# Patient Record
Sex: Male | Born: 1955
Health system: Southern US, Community
[De-identification: ages and names within clinical notes are randomized; demographics above are authoritative.]

## PROBLEM LIST (undated history)

## (undated) DIAGNOSIS — E7800 Pure hypercholesterolemia, unspecified: Secondary | ICD-10-CM

## (undated) DIAGNOSIS — J45909 Unspecified asthma, uncomplicated: Secondary | ICD-10-CM

## (undated) DIAGNOSIS — E785 Hyperlipidemia, unspecified: Secondary | ICD-10-CM

## (undated) DIAGNOSIS — I10 Essential (primary) hypertension: Secondary | ICD-10-CM

## (undated) DIAGNOSIS — I219 Acute myocardial infarction, unspecified: Secondary | ICD-10-CM

## (undated) HISTORY — DX: Pure hypercholesterolemia, unspecified: E78.00

## (undated) HISTORY — PX: CORONARY STENT PLACEMENT: SHX1402

---

## 2005-11-08 ENCOUNTER — Emergency Department (HOSPITAL_COMMUNITY): Admission: EM | Admit: 2005-11-08 | Discharge: 2005-11-08 | Payer: Self-pay | Admitting: Emergency Medicine

## 2012-09-03 ENCOUNTER — Other Ambulatory Visit: Payer: Self-pay | Admitting: Allergy

## 2012-09-03 ENCOUNTER — Ambulatory Visit
Admission: RE | Admit: 2012-09-03 | Discharge: 2012-09-03 | Disposition: A | Discharge status: Home / Self Care | Payer: BC Managed Care – PPO | Source: Ambulatory Visit | Attending: Allergy | Admitting: Allergy

## 2012-09-03 DIAGNOSIS — J209 Acute bronchitis, unspecified: Secondary | ICD-10-CM

## 2012-10-25 ENCOUNTER — Emergency Department (HOSPITAL_COMMUNITY)
Admission: EM | Admit: 2012-10-25 | Discharge: 2012-10-25 | Disposition: A | Discharge status: Home / Self Care | Payer: BC Managed Care – PPO | Attending: Emergency Medicine | Admitting: Emergency Medicine

## 2012-10-25 ENCOUNTER — Emergency Department (HOSPITAL_COMMUNITY): Payer: BC Managed Care – PPO

## 2012-10-25 ENCOUNTER — Encounter (HOSPITAL_COMMUNITY): Payer: Self-pay | Admitting: Emergency Medicine

## 2012-10-25 DIAGNOSIS — I252 Old myocardial infarction: Secondary | ICD-10-CM | POA: Insufficient documentation

## 2012-10-25 DIAGNOSIS — I10 Essential (primary) hypertension: Secondary | ICD-10-CM | POA: Insufficient documentation

## 2012-10-25 DIAGNOSIS — R05 Cough: Secondary | ICD-10-CM

## 2012-10-25 DIAGNOSIS — I214 Non-ST elevation (NSTEMI) myocardial infarction: Secondary | ICD-10-CM

## 2012-10-25 DIAGNOSIS — I2510 Atherosclerotic heart disease of native coronary artery without angina pectoris: Secondary | ICD-10-CM

## 2012-10-25 DIAGNOSIS — R0789 Other chest pain: Secondary | ICD-10-CM | POA: Insufficient documentation

## 2012-10-25 DIAGNOSIS — Z792 Long term (current) use of antibiotics: Secondary | ICD-10-CM | POA: Insufficient documentation

## 2012-10-25 DIAGNOSIS — Z79899 Other long term (current) drug therapy: Secondary | ICD-10-CM | POA: Insufficient documentation

## 2012-10-25 DIAGNOSIS — R079 Chest pain, unspecified: Secondary | ICD-10-CM

## 2012-10-25 DIAGNOSIS — Z95820 Peripheral vascular angioplasty status with implants and grafts: Secondary | ICD-10-CM

## 2012-10-25 DIAGNOSIS — Z7982 Long term (current) use of aspirin: Secondary | ICD-10-CM | POA: Insufficient documentation

## 2012-10-25 DIAGNOSIS — J40 Bronchitis, not specified as acute or chronic: Secondary | ICD-10-CM

## 2012-10-25 DIAGNOSIS — R61 Generalized hyperhidrosis: Secondary | ICD-10-CM | POA: Insufficient documentation

## 2012-10-25 DIAGNOSIS — R059 Cough, unspecified: Secondary | ICD-10-CM | POA: Insufficient documentation

## 2012-10-25 DIAGNOSIS — J45909 Unspecified asthma, uncomplicated: Secondary | ICD-10-CM | POA: Insufficient documentation

## 2012-10-25 DIAGNOSIS — E785 Hyperlipidemia, unspecified: Secondary | ICD-10-CM | POA: Insufficient documentation

## 2012-10-25 HISTORY — DX: Essential (primary) hypertension: I10

## 2012-10-25 HISTORY — DX: Acute myocardial infarction, unspecified: I21.9

## 2012-10-25 HISTORY — DX: Unspecified asthma, uncomplicated: J45.909

## 2012-10-25 HISTORY — DX: Hyperlipidemia, unspecified: E78.5

## 2012-10-25 LAB — CBC WITH DIFFERENTIAL/PLATELET
Basophils Absolute: 0 10*3/uL (ref 0.0–0.1)
Basophils Relative: 0 % (ref 0–1)
Eosinophils Absolute: 0 10*3/uL (ref 0.0–0.7)
Eosinophils Relative: 0 % (ref 0–5)
Hemoglobin: 14.3 g/dL (ref 13.0–17.0)
Lymphocytes Relative: 9 % — ABNORMAL LOW (ref 12–46)
Lymphs Abs: 1.8 10*3/uL (ref 0.7–4.0)
MCH: 27.8 pg (ref 26.0–34.0)
MCHC: 33.3 g/dL (ref 30.0–36.0)
Monocytes Absolute: 2 10*3/uL — ABNORMAL HIGH (ref 0.1–1.0)
Neutro Abs: 16.3 10*3/uL — ABNORMAL HIGH (ref 1.7–7.7)
Platelets: 314 10*3/uL (ref 150–400)

## 2012-10-25 LAB — BASIC METABOLIC PANEL
BUN: 19 mg/dL (ref 6–23)
CO2: 23 mEq/L (ref 19–32)
Creatinine, Ser: 0.93 mg/dL (ref 0.50–1.35)
Glucose, Bld: 108 mg/dL — ABNORMAL HIGH (ref 70–99)
Sodium: 140 mEq/L (ref 135–145)

## 2012-10-25 LAB — TROPONIN I: Troponin I: <0.3 ng/mL (ref <0.30)

## 2012-10-25 MED ORDER — LEVALBUTEROL TARTRATE 45 MCG/ACT IN AERO: 1.0000 | INHALATION_SPRAY | RESPIRATORY_TRACT | Status: DC | PRN

## 2012-10-25 MED ORDER — ALBUTEROL SULFATE (5 MG/ML) 0.5% IN NEBU
2.5000 mg | INHALATION_SOLUTION | RESPIRATORY_TRACT | Status: AC
  Administered 2012-10-25: 2.5 mg via RESPIRATORY_TRACT
  Filled 2012-10-25: qty 0.5

## 2012-10-25 MED ORDER — IPRATROPIUM BROMIDE 0.02 % IN SOLN
0.5000 mg | RESPIRATORY_TRACT | Status: AC
  Administered 2012-10-25: 0.5 mg via RESPIRATORY_TRACT
  Filled 2012-10-25: qty 2.5

## 2012-10-25 MED ORDER — GUAIFENESIN-CODEINE 100-10 MG/5ML PO SYRP: 5.0000 mL | ORAL_SOLUTION | Freq: Three times a day (TID) | ORAL | Status: DC | PRN

## 2012-10-25 MED ORDER — ACETAMINOPHEN 325 MG PO TABS: 650.0000 mg | ORAL_TABLET | Freq: Once | ORAL | Status: DC

## 2012-10-25 MED ORDER — GUAIFENESIN-CODEINE 100-10 MG/5ML PO SOLN
5.0000 mL | Freq: Once | ORAL | Status: AC
  Administered 2012-10-25: 5 mL via ORAL
  Filled 2012-10-25: qty 5

## 2012-10-25 MED ORDER — ASPIRIN 81 MG PO CHEW
324.0000 mg | CHEWABLE_TABLET | Freq: Once | ORAL | Status: AC
  Administered 2012-10-25: 324 mg via ORAL
  Filled 2012-10-25: qty 4

## 2012-10-25 MED ORDER — IPRATROPIUM BROMIDE 0.02 % IN SOLN: 0.5000 mg | Freq: Once | RESPIRATORY_TRACT | Status: DC

## 2012-10-25 MED ORDER — ALBUTEROL SULFATE (5 MG/ML) 0.5% IN NEBU: 2.5000 mg | INHALATION_SOLUTION | Freq: Once | RESPIRATORY_TRACT | Status: DC

## 2012-10-25 NOTE — ED Notes (Signed)
Pharmacy technician at bedside asking about home medications.

## 2012-10-25 NOTE — ED Notes (Addendum)
Per EMS, patient has had chest cold for about a week.  Was seen by his PCP and given antibiotics; patient reports that he has been taking his antibiotics for a week.  Upon lying down tonight, patient began to get short of breath.  Ambulatory on scene.  Patient denies chest pain, diaphoresis, nausea and vomiting.  Complaining of cough; patient coughing (non-productive) upon arrival.

## 2012-10-25 NOTE — ED Provider Notes (Signed)
Medical screening examination/treatment/procedure(s) were conducted as a shared visit with non-physician practitioner(s) and myself.  I personally evaluated the patient during the encounter  Derwood Kaplan, MD 10/25/12 1824

## 2012-10-25 NOTE — ED Provider Notes (Signed)
History     CSN: [id number]  Arrival date & time 10/25/12  4098   First MD Initiated Contact with Patient 10/25/12 [phone]      Chief Complaint  Patient presents with  . Shortness of Breath  . Cough    (Consider location/radiation/quality/duration/timing/severity/associated sxs/prior treatment) Patient is a 57 y.o. male presenting with shortness of breath and cough. The history is provided by the patient, a friend and the EMS personnel. No language interpreter was used.  Shortness of Breath Severity:  Moderate Onset quality:  Sudden Timing:  Constant Progression:  Resolved Chronicity:  New Context: URI   Context: not smoke exposure   Relieved by: Nitroglycerin. Associated symptoms: chest pain, cough, diaphoresis and sputum production   Associated symptoms: no abdominal pain, no fever, no sore throat, no syncope, no vomiting and no wheezing   Cough Associated symptoms: chest pain, diaphoresis and shortness of breath   Associated symptoms: no fever, no sore throat and no wheezing    57 year old male with a past medical history MI, heart cholesterol, asthma, and hypertension here today with complaint of chest pain that woke him around 5 AM. Patient states that he became diaphoretic, sob and felt tightness in his left chest 5/10.  Patient took one nitroglycerin and the pain went away. Also complaining of a productive cough x2 weeks. Patient went to his PCP in [place] and was put on Tama on January 31. States that he is not getting better. Denies fever or nausea vomiting, calf pain/swelling or long trips.  Patient had an MI in 2005 and has a cardiac stent in his heart. He has had no aspirin today. Patient goes to Dr. Alfonse Alpers  [phone] cardiology in [facility].   Past Medical History  Diagnosis Date  . MI (myocardial infarction)   . Asthma   . Hypertension   . Hyperlipidemia     Past Surgical History  Procedure Laterality Date  . Coronary stent placement       History reviewed. No pertinent family history.  History  Substance Use Topics  . Smoking status: Never Smoker   . Smokeless tobacco: Not on file  . Alcohol Use: No      Review of Systems  Constitutional: Positive for diaphoresis. Negative for fever.  HENT: Negative.  Negative for sore throat.   Eyes: Negative.   Respiratory: Positive for cough, sputum production and shortness of breath. Negative for wheezing.   Cardiovascular: Positive for chest pain. Negative for palpitations and syncope.  Gastrointestinal: Negative.  Negative for vomiting and abdominal pain.  Neurological: Negative.   Psychiatric/Behavioral: Negative.   All other systems reviewed and are negative.    Allergies  Review of patient's allergies indicates no known allergies.  Home Medications   Current Outpatient Rx  Name  Route  Sig  Dispense  Refill  . cefdinir (OMNICEF) 300 MG capsule   Oral   Take 300 mg by mouth 2 (two) times daily. For 10 days           BP 145/100  Pulse 94  Temp(Src) 98.1 F (36.7 C) (Oral)  Resp 18  SpO2 100%  Physical Exam  Nursing note and vitals reviewed. Constitutional: He is oriented to person, place, and time. He appears well-developed and well-nourished.  HENT:  Head: Normocephalic.  Eyes: Conjunctivae and EOM are normal. Pupils are equal, round, and reactive to light.  Neck: Normal range of motion. Neck supple.  Cardiovascular: Normal rate.   Pulmonary/Chest: Effort normal and breath sounds normal. No  respiratory distress. He has no wheezes.  Abdominal: Soft. Bowel sounds are normal. He exhibits no distension.  Musculoskeletal: Normal range of motion.  Neurological: He is alert and oriented to person, place, and time.  Skin: Skin is warm and dry. No rash noted.  Psychiatric: He has a normal mood and affect.    ED Course  Procedures (including critical care time)   9am  Spoke with Dr. Eirene Rather Fu who will see this patient in the ER.  We will contact his  cardiologist in the mean time. [id number]  Spoke with cardiologist in [place] who states that this patient had a normal stress test in October 2013. Patient can follow up next week with Dr. Hal Hope.    11am  Dr. Allexis Bordenave Fu has not seen patient yet. Notified of records from Dr. Hal Hope.  Will come see patient now.    12n  Dr Terron Merfeld Fu in and ok to discharge with follow up prior to trip to [place].    Labs Reviewed - No data to display No results found.   No diagnosis found.    MDM  CP and cough with normal stress test in October per his cardiologist in [facility] Dr. Hal Hope who he will follow up with.  He will continue antibiotic and prednisone for cough.         Remi Haggard, NP 10/25/12 1238

## 2012-10-25 NOTE — ED Notes (Signed)
Pt discharged home. Had no further questions. Vital signs stable.

## 2012-10-25 NOTE — ED Notes (Signed)
Spoke with NP.  She stated cardiology told here they would assess pt 1.5 hours ago.  Pt notified. Pt stated they feel MUCH better after breathing tx.

## 2012-10-25 NOTE — ED Notes (Signed)
Patient states that he became short of breath while lying down last night; around 0530 this morning, patient reports that he started having chest tightness (rates chest tightness 5/10).  Patient reports productive cough for two weeks with yellow sputum production.  Patient alert and oriented x4; PERRL present.  Upon arrival to room, patient changed into gown and connected to continuous cardiac, pulse ox, and blood pressure monitor.  Will continue to monitor.

## 2012-10-25 NOTE — Consult Note (Signed)
Admit date: 10/25/2012 Referring Physician  Dr. Rhunette Croft Primary Physician No primary provider on file. Primary Cardiologist  Dr. Alfonse Alpers [phone] cardiology in [facility].   Reason for Consultation  : Chest pain  HPI: 57 year old male with coronary artery disease status post myocardial infarction in 2005 with previous stent placement at that time with recent stress test in October of 2013, low risk who presented to the emergency department this morning with chest discomfort, shortness of breath, diaphoresis. He was complaining of tightness and shortness of breath left-sided 5/10 pain that occurred this morning at 5 AM, woke him up. He's been complaining of productive cough over the past 2 weeks and has been trying different antibiotics as well as prednisone. He has a white count of 20,000 but this is likely secondary to steroid use. EKG here in the emergency department was normal, chest x-ray was also unremarkable. No evidence of ischemia. 2 sets of cardiac troponins were done and were normal. Remi Haggard personally spoke to his cardiologist in [place] who relate to her the stress test findings from October. He stated that he would work him in on Monday if he called the office. He is planning a trip to [place] that leaves late on Monday.   PMH:   Past Medical History  Diagnosis Date  . MI (myocardial infarction)   . Asthma   . Hypertension   . Hyperlipidemia     PSH:   Past Surgical History  Procedure Laterality Date  . Coronary stent placement     Allergies:  Review of patient's allergies indicates no known allergies. Prior to Admit Meds:   (Not in a hospital admission) Prior to Admission medications   Medication Sig Start Date End Date Taking? Authorizing Provider  aspirin EC 81 MG tablet Take 162 mg by mouth at bedtime.   Yes Historical Provider, MD  budesonide-formoterol (SYMBICORT) 160-4.5 MCG/ACT inhaler Inhale 2 puffs into the lungs 2 (two) times daily.   Yes Historical  Provider, MD  cefdinir (OMNICEF) 300 MG capsule Take 300 mg by mouth 2 (two) times daily. For 10 days beginning 10-17-12 For upper respiratory infection   Yes Historical Provider, MD  levocetirizine (XYZAL) 5 MG tablet Take 5 mg by mouth at bedtime.   Yes Historical Provider, MD  losartan (COZAAR) 25 MG tablet Take 25 mg by mouth daily.   Yes Historical Provider, MD  montelukast (SINGULAIR) 10 MG tablet Take 10 mg by mouth at bedtime.   Yes Historical Provider, MD  nitroGLYCERIN (NITROSTAT) 0.4 MG SL tablet Place 0.4 mg under the tongue every 5 (five) minutes as needed for chest pain.   Yes Historical Provider, MD  predniSONE (DELTASONE) 10 MG tablet Take 10 mg by mouth daily. Tapering dose beginning 10/17/12, Has 3 days remaining   Yes Historical Provider, MD  simvastatin (ZOCOR) 40 MG tablet Take 40 mg by mouth every evening.   Yes Historical Provider, MD   Fam HX:   History reviewed. No pertinent family history. Father had valve replacement  Social HX:    History   Social History  . Marital Status: Married    Spouse Name: N/A    Number of Children: N/A  . Years of Education: N/A   Occupational History  . Not on file.   Social History Main Topics  . Smoking status: Never Smoker   . Smokeless tobacco: Not on file  . Alcohol Use: No  . Drug Use: No  . Sexually Active: Not on file   Other Topics  Concern  . Not on file   Social History Narrative  . No narrative on file    Does not smoke, does not drink. Vegetarian.  ROS:  Recent cough, chest discomfort, shortness of breath, denies any fevers, hematemesis, bleeding, rash. All 11 ROS were addressed and are negative except what is stated in the HPI  Physical Exam: Blood pressure 166/94, pulse 89, temperature 98.1 F (36.7 C), temperature source Oral, resp. rate 17, SpO2 97.00%.    General: Well developed, well nourished, in no acute distress Head: Eyes PERRLA, No xanthomas.   Normal cephalic and atramatic  Lungs:  Left lower  lobe wheezes heard with expiration. Otherwise normal respiratory effort. Heart:   HRRR S1 S2 Pulses are 2+ & equal. No appreciable murmurs            No carotid bruit. No JVD.  No abdominal bruits. No femoral bruits. Abdomen: Bowel sounds are positive, abdomen soft and non-tender without masses. No hepatosplenomegaly. Msk:  Back normal. Normal strength and tone for age. Extremities:   No clubbing, cyanosis or edema.  DP +1 Neuro: Alert and oriented X 3, non-focal, MAE x 4 GU: Deferred Rectal: Deferred Psych:  Good affect, responds appropriately    Labs:   Lab Results  Component Value Date   WBC 20.0* 10/25/2012   HGB 14.3 10/25/2012   HCT 42.9 10/25/2012   MCV 83.3 10/25/2012   PLT 314 10/25/2012    Recent Labs Lab 10/25/12 0716  NA 140  K 4.0  CL 105  CO2 23  BUN 19  CREATININE 0.93  CALCIUM 9.5  GLUCOSE 108*   Lab Results  Component Value Date   TROPONINI <0.30 10/25/2012    Second troponin also normal     Radiology:  Dg Chest 2 View  10/25/2012  *RADIOLOGY REPORT*  Clinical Data: 57 year old male with shortness of breath and nonproductive cough.  Wheezing.  CHEST - 2 VIEW  Comparison: 09/03/2012 and earlier.  Findings: Stable and normal lung volumes.  Cardiac size and mediastinal contours are within normal limits.  Tracheal air column is stable within normal limits.  Lungs are clear.  No pneumothorax, pulmonary edema, pleural effusion or confluent pulmonary opacity. No acute osseous abnormality identified.  IMPRESSION: No acute cardiopulmonary abnormality.   Original Report Authenticated By: Erskine Speed, M.D.    Personally viewed.  EKG:  Sinus rhythm with no ST segment changes Personally viewed.   ASSESSMENT/PLAN:   57 year old male with known coronary artery disease status post MI in 2005 with stent placement who presented with shortness of breath, chest tightness, leukocytosis consistent with asthmatic bronchitis.  1. Chest pain-likely noncardiac. Troponins have been  reassuring. EKG reassuring. October stress test in 2013 was also reassuring and low risk. It is likely that he had musculoskeletal discomfort in the setting of severe coughing. He still has ongoing mild wheeze in his left lower lung. Felt much better after nebulizer. Continue with inhalers. He does have the ability to see his cardiologist on Monday. Encouraged him to call the office. I have given him my card as well. If he does decide to go on airplane flight Monday to [place], make sure that he does have adequate inhalers.   2. Coronary artery disease-prior stent placement. No evidence of myocardial infarction currently.  3. Asthmatic bronchitis-continuing with antibiotics, inhalers. Per ED physician.  I'm comfortable with him being discharged currently. Continue with current prevention efforts with hypertension, hyperlipidemia regimen.    Donato Schultz, MD  10/25/2012  12:14  PM

## 2013-03-07 DIAGNOSIS — H401190 Primary open-angle glaucoma, unspecified eye, stage unspecified: Secondary | ICD-10-CM | POA: Insufficient documentation

## 2014-08-24 ENCOUNTER — Encounter: Payer: Self-pay | Admitting: Neurology

## 2014-08-24 ENCOUNTER — Ambulatory Visit (INDEPENDENT_AMBULATORY_CARE_PROVIDER_SITE_OTHER): Payer: BC Managed Care – PPO | Admitting: Neurology

## 2014-08-24 VITALS — BP 131/80 | HR 79 | Temp 97.6°F | Ht 64.0 in | Wt 153.0 lb

## 2014-08-24 DIAGNOSIS — Z955 Presence of coronary angioplasty implant and graft: Secondary | ICD-10-CM

## 2014-08-24 DIAGNOSIS — G4733 Obstructive sleep apnea (adult) (pediatric): Secondary | ICD-10-CM

## 2014-08-24 NOTE — Progress Notes (Signed)
Subjective:    Patient ID: George Knight is a 58 y.o. male.  HPI     Huston Foley, MD, PhD [facility] [address], [address] [address] [place], [place] [address]  Dear Dr. Nicholos Johns,  I saw your patient, George Knight, upon your kind request in my neurologic clinic today for initial consultation of his sleep disorder, in particular, concern for underlying obstructive sleep apnea. The patient is unaccompanied today. As you know, George Knight is a 58 year old right-handed gentleman with an underlying medical history of coronary artery disease (status post PTCA with 2 stents placed in 2005) for which she is followed by Dr. Jacinto Halim, impaired fasting glucose, essential hypertension, mixed hyperlipidemia, erectile dysfunction, and mildly overweight state, who reports snoring and excessive daytime somnolence, as well as witnessed apneas per wife's report.  His typical bedtime is reported to be around 11 PM and usual wake time is around 6:30 to 7 AM. Sleep onset typically occurs within a few minutes. He reports feeling adequately rested upon awakening. He wakes up on an average 1 times in the middle of the night and has to go to the bathroom 1 times on a typical night, but not every night. He denies morning headaches.  He reports excessive daytime somnolence (EDS) and His Epworth Sleepiness Score (ESS) is 9/24 today. He has not fallen asleep while driving. The patient has not been taking a planned nap, but he sometimes naps.  He has been known to snore for the past many years. Snoring is reportedly moderate, and associated with choking sounds and witnessed apneas. The patient admits to a sense of gasping at times. He denies GERD at night and no nighttime cough experienced. The patient has not noted any RLS symptoms and is not known to kick while asleep or before falling asleep. There is no family history of RLS or OSA, but his father snores loudly.  He is a restless sleeper and in  the morning, the bed is quite disheveled.   He denies cataplexy, sleep paralysis, hypnagogic or hypnopompic hallucinations, or sleep attacks. He does not report any vivid dreams, nightmares, dream enactments, or parasomnias, such as sleep talking or sleep walking. The patient has not had a sleep study or a home sleep test.  He consumes 2 caffeinated beverages per day, usually in the form of tea, green tea.  His bedroom is usually dark and cool. There is TV in the bedroom and usually it is on at night.   His Past Medical History Is Significant For: Past Medical History  Diagnosis Date  . MI (myocardial infarction)   . Asthma   . Hypertension   . Hyperlipidemia   . High cholesterol     His Past Surgical History Is Significant For: Past Surgical History  Procedure Laterality Date  . Coronary stent placement      His Family History Is Significant For: Family History  Problem Relation Age of Onset  . Pancreatic cancer Mother   . Diabetes Father   . Hypertension Father     His Social History Is Significant For: History   Social History  . Marital Status: Married    Spouse Name: Ranjan    Number of Children: 2  . Years of Education: degree   Occupational History  .      hospitality   Social History Main Topics  . Smoking status: Never Smoker   . Smokeless tobacco: Former Neurosurgeon    Types: Chew     Comment: quit  2005  . Alcohol Use: 0.0 oz/week    0 Not specified per week     Comment: occas. beer  . Drug Use: No  . Sexual Activity: None   Other Topics Concern  . None   Social History Narrative   Patient is consumes Automotive engineer. caffeine    His Allergies Are:  No Known Allergies:   His Current Medications Are:  Outpatient Encounter Prescriptions as of 08/24/2014  Medication Sig  . aspirin EC 81 MG tablet Take 162 mg by mouth at bedtime.  Marland Kitchen azelastine (ASTELIN) 0.1 % nasal spray Place into both nostrils daily. Use in each nostril as directed  . brimonidine (ALPHAGAN)  0.15 % ophthalmic solution 1 drop 2 (two) times daily.  . budesonide-formoterol (SYMBICORT) 160-4.5 MCG/ACT inhaler Inhale 2 puffs into the lungs 2 (two) times daily.  Marland Kitchen latanoprost (XALATAN) 0.005 % ophthalmic solution 1 drop at bedtime.  . levalbuterol (XOPENEX HFA) 45 MCG/ACT inhaler Inhale 1-2 puffs into the lungs every 4 (four) hours as needed for wheezing.  Marland Kitchen levocetirizine (XYZAL) 5 MG tablet Take 5 mg by mouth at bedtime.  Marland Kitchen losartan (COZAAR) 25 MG tablet Take 25 mg by mouth daily. 100 mg daily  . montelukast (SINGULAIR) 10 MG tablet Take 10 mg by mouth at bedtime.  . nitroGLYCERIN (NITROSTAT) 0.4 MG SL tablet Place 0.4 mg under the tongue every 5 (five) minutes as needed for chest pain.  Marland Kitchen OVER THE COUNTER MEDICATION Take 1 tablet by mouth. Multivitamin , one tablet daily  . predniSONE (DELTASONE) 10 MG tablet Take 10 mg by mouth daily. Tapering dose beginning 10/17/12, Has 3 days remaining  . simvastatin (ZOCOR) 40 MG tablet Take 20 mg by mouth every evening. 20 mg daily  . azelastine (OPTIVAR) 0.05 % ophthalmic solution 1 drop 2 (two) times daily.  . [DISCONTINUED] cefdinir (OMNICEF) 300 MG capsule Take 300 mg by mouth 2 (two) times daily. For 10 days beginning 10-17-12 For upper respiratory infection  . [DISCONTINUED] guaiFENesin-codeine (ROBITUSSIN AC) 100-10 MG/5ML syrup Take 5 mLs by mouth 3 (three) times daily as needed for cough. (Patient not taking: Reported on 08/24/2014)  :  Review of Systems:  Out of a complete 14 point review of systems, all are reviewed and negative with the exception of these symptoms as listed below:   Review of Systems  Allergic/Immunologic:       Allergies  Neurological:       Snoring    Objective:  Neurologic Exam  Physical Exam Physical Examination:   Filed Vitals:   08/24/14 0900  BP: 131/80  Pulse: 79  Temp: 97.6 F (36.4 C)    General Examination: The patient is a very pleasant 58 y.o. male in no acute distress. He appears  well-developed and well-nourished and very well groomed.   HEENT: Normocephalic, atraumatic, pupils are equal, round and reactive to light and accommodation. Funduscopic exam is normal with sharp disc margins noted. Extraocular tracking is good without limitation to gaze excursion or nystagmus noted. Normal smooth pursuit is noted. Hearing is grossly intact. Tympanic membranes are clear bilaterally. Face is symmetric with normal facial animation and normal facial sensation. Speech is clear with no dysarthria noted. There is no hypophonia. There is no lip, neck/head, jaw or voice tremor. Neck is supple with full range of passive and active motion. There are no carotid bruits on auscultation. Oropharynx exam reveals: mild mouth dryness, good dental hygiene and moderate airway crowding, due to narrow airway entry, thicker soft palate and  redundant soft palate. There is mild pharyngeal erythema and uvula appears mildly swollen. Mallampati is class III. Tongue protrudes centrally and palate elevates symmetrically. Tonsils are absent. Neck size is 15 5/8 inches. He has a Mild overbite. Nasal inspection reveals no significant nasal mucosal bogginess or redness and no septal deviation.   Chest: Clear to auscultation without wheezing, rhonchi or crackles noted.  Heart: S1+S2+0, regular and normal without murmurs, rubs or gallops noted.   Abdomen: Soft, non-tender and non-distended with normal bowel sounds appreciated on auscultation.  Extremities: There is no pitting edema in the distal lower extremities bilaterally. Pedal pulses are intact.  Skin: Warm and dry without trophic changes noted. There are no varicose veins.  Musculoskeletal: exam reveals no obvious joint deformities, tenderness or joint swelling or erythema.   Neurologically:  Mental status: The patient is awake, alert and oriented in all 4 spheres. His immediate and remote memory, attention, language skills and fund of knowledge are  appropriate. There is no evidence of aphasia, agnosia, apraxia or anomia. Speech is clear with normal prosody and enunciation. Thought process is linear. Mood is normal and affect is normal.  Cranial nerves II - XII are as described above under HEENT exam. In addition: shoulder shrug is normal with equal shoulder height noted. Motor exam: Normal bulk, strength and tone is noted. There is no drift, tremor or rebound. Romberg is negative. Reflexes are 2+ throughout. Babinski: Toes are flexor bilaterally. Fine motor skills and coordination: intact with normal finger taps, normal hand movements, normal rapid alternating patting, normal foot taps and normal foot agility.  Cerebellar testing: No dysmetria or intention tremor on finger to nose testing. Heel to shin is unremarkable bilaterally. There is no truncal or gait ataxia.  Sensory exam: intact to light touch, pinprick, vibration, temperature sense in the upper and lower extremities.  Gait, station and balance: He stands easily. No veering to one side is noted. No leaning to one side is noted. Posture is age-appropriate and stance is narrow based. Gait shows normal stride length and normal pace. No problems turning are noted. He turns en bloc.    Assessment and Plan:   In summary, George Knight is a very pleasant 58 y.o.-year old male with an underlying medical history of coronary artery disease (status post PTCA with 2 stents placed in 2005) for which she is followed by Dr. Jacinto HalimGanji, impaired fasting glucose, essential hypertension, mixed hyperlipidemia, erectile dysfunction, and mildly overweight state, who reports snoring and excessive daytime somnolence, as well as witnessed apneas . His history and physical exam are in keeping with underlying obstructive sleep apnea (OSA). I had a long chat with the patient about my findings and the diagnosis of OSA, its prognosis and treatment options. We talked about medical treatments, surgical interventions and  non-pharmacological approaches. I explained in particular the risks and ramifications of untreated moderate to severe OSA, especially with respect to developing cardiovascular disease down the Road, including congestive heart failure, difficult to treat hypertension, cardiac arrhythmias, or stroke. Even type 2 diabetes has, in part, been linked to untreated OSA. Symptoms of untreated OSA include daytime sleepiness, memory problems, mood irritability and mood disorder such as depression and anxiety, lack of energy, as well as recurrent headaches, especially morning headaches. We talked about trying to maintain a healthy lifestyle in general, as well as the importance of weight control. I encouraged the patient to eat healthy, exercise daily and keep well hydrated, to keep a scheduled bedtime and wake time routine, to  not skip any meals and eat healthy snacks in between meals. I advised the patient not to drive when feeling sleepy. I recommended the following at this time: sleep study with potential positive airway pressure titration. (We will score hypopneas at 3% and split the sleep study into diagnostic and treatment portion, if the estimated. 2 hour AHI is >15/h).   I explained the sleep test procedure to the patient and also outlined possible surgical and non-surgical treatment options of OSA, including the use of a custom-made dental device (which would require a referral to a specialist dentist or oral surgeon), upper airway surgical options, such as pillar implants, radiofrequency surgery, tongue base surgery, and UPPP (which would involve a referral to an ENT surgeon). Rarely, jaw surgery such as mandibular advancement may be considered.  I also explained the CPAP treatment option to the patient, who indicated that he would be willing to try CPAP if the need arises. I explained the importance of being compliant with PAP treatment, not only for insurance purposes but primarily to improve His symptoms, and  for the patient's long term health benefit, including to reduce His cardiovascular risks. I answered all his questions today and the patient was in agreement. I would like to see him back after the sleep study is completed and encouraged him to call with any interim questions, concerns, problems or updates.   Thank you very much for allowing me to participate in the care of this nice patient. If I can be of any further assistance to you please do not hesitate to call me at [phone].  Sincerely,   Huston FoleySaima Cylah Fannin, MD, PhD

## 2014-08-24 NOTE — Patient Instructions (Signed)

## 2014-10-14 ENCOUNTER — Ambulatory Visit (INDEPENDENT_AMBULATORY_CARE_PROVIDER_SITE_OTHER): Payer: BLUE CROSS/BLUE SHIELD | Admitting: Neurology

## 2014-10-14 VITALS — BP 159/86 | HR 74 | Ht 64.0 in | Wt 153.0 lb

## 2014-10-14 DIAGNOSIS — G4733 Obstructive sleep apnea (adult) (pediatric): Secondary | ICD-10-CM

## 2014-10-14 DIAGNOSIS — G479 Sleep disorder, unspecified: Secondary | ICD-10-CM

## 2014-10-15 NOTE — Sleep Study (Signed)
See attached sheet scanned into Encounters tab

## 2014-10-20 ENCOUNTER — Encounter: Payer: Self-pay | Admitting: Neurology

## 2014-10-20 ENCOUNTER — Encounter: Payer: Self-pay | Admitting: *Deleted

## 2014-10-20 ENCOUNTER — Telehealth: Payer: Self-pay | Admitting: Neurology

## 2014-10-20 DIAGNOSIS — G4733 Obstructive sleep apnea (adult) (pediatric): Secondary | ICD-10-CM

## 2014-10-20 NOTE — Telephone Encounter (Signed)
Please call and notify patient that the recent sleep study confirmed the diagnosis of OSA. He did well with CPAP during the study with significant improvement of the respiratory events. Therefore, I would like start the patient on CPAP at home. I placed the order in the chart.   Arrange for CPAP set up at home through a DME company of patient's choice and fax/route report to PCP and referring MD (if other than PCP).   The patient will also need a follow up appointment with me in 6-8 weeks post set up that has to be scheduled; help the patient schedule this (in a follow-up slot).   Please re-enforce the importance of compliance with treatment and the need for us to monitor compliance data.   Once you have spoken to the patient and scheduled the return appointment, you may close this encounter, thanks,   Jonnae Fonseca, MD, PhD [facility] ([facility])    

## 2014-10-20 NOTE — Telephone Encounter (Signed)
Patient was contacted and provided the results of his split night sleep study that revealed a diagnosis of sleep apnea.  Patient was informed that treatment in the form of CPAP therapy was recommended by Dr. Frances FurbishAthar and that it was considered successful in treating his sleep apnea.  The patient was in agreement and was referred to [facility] for CPAP set up.  Dr. Nicholos Johnsamachandran was faxed a copy of the report.  The patient gave verbal permission to mail a copy of his test results.  Patient instructed to contact our office 6-8 weeks post set up to schedule a follow up appointment.

## 2015-01-10 ENCOUNTER — Encounter: Payer: Self-pay | Admitting: Neurology

## 2016-01-05 DIAGNOSIS — Z0000 Encounter for general adult medical examination without abnormal findings: Secondary | ICD-10-CM | POA: Diagnosis not present

## 2016-01-10 DIAGNOSIS — I10 Essential (primary) hypertension: Secondary | ICD-10-CM | POA: Diagnosis not present

## 2016-01-10 DIAGNOSIS — I2510 Atherosclerotic heart disease of native coronary artery without angina pectoris: Secondary | ICD-10-CM | POA: Diagnosis not present

## 2016-01-10 DIAGNOSIS — E782 Mixed hyperlipidemia: Secondary | ICD-10-CM | POA: Diagnosis not present

## 2016-01-10 DIAGNOSIS — Z0000 Encounter for general adult medical examination without abnormal findings: Secondary | ICD-10-CM | POA: Diagnosis not present

## 2016-02-01 DIAGNOSIS — H401132 Primary open-angle glaucoma, bilateral, moderate stage: Secondary | ICD-10-CM | POA: Diagnosis not present

## 2016-02-15 DIAGNOSIS — H401132 Primary open-angle glaucoma, bilateral, moderate stage: Secondary | ICD-10-CM | POA: Diagnosis not present

## 2016-03-07 DIAGNOSIS — J300 Vasomotor rhinitis: Secondary | ICD-10-CM | POA: Diagnosis not present

## 2016-03-07 DIAGNOSIS — H1045 Other chronic allergic conjunctivitis: Secondary | ICD-10-CM | POA: Diagnosis not present

## 2016-03-07 DIAGNOSIS — J4530 Mild persistent asthma, uncomplicated: Secondary | ICD-10-CM | POA: Diagnosis not present

## 2016-03-07 DIAGNOSIS — J209 Acute bronchitis, unspecified: Secondary | ICD-10-CM | POA: Diagnosis not present

## 2016-04-20 DIAGNOSIS — R05 Cough: Secondary | ICD-10-CM | POA: Diagnosis not present

## 2016-04-20 DIAGNOSIS — Z9109 Other allergy status, other than to drugs and biological substances: Secondary | ICD-10-CM | POA: Diagnosis not present

## 2016-04-27 DIAGNOSIS — J4530 Mild persistent asthma, uncomplicated: Secondary | ICD-10-CM | POA: Diagnosis not present

## 2016-04-27 DIAGNOSIS — H1045 Other chronic allergic conjunctivitis: Secondary | ICD-10-CM | POA: Diagnosis not present

## 2016-04-27 DIAGNOSIS — J300 Vasomotor rhinitis: Secondary | ICD-10-CM | POA: Diagnosis not present

## 2016-04-27 DIAGNOSIS — H6691 Otitis media, unspecified, right ear: Secondary | ICD-10-CM | POA: Diagnosis not present

## 2016-05-22 DIAGNOSIS — I10 Essential (primary) hypertension: Secondary | ICD-10-CM | POA: Diagnosis not present

## 2016-05-22 DIAGNOSIS — R7303 Prediabetes: Secondary | ICD-10-CM | POA: Diagnosis not present

## 2016-05-29 DIAGNOSIS — I10 Essential (primary) hypertension: Secondary | ICD-10-CM | POA: Diagnosis not present

## 2016-05-29 DIAGNOSIS — R7303 Prediabetes: Secondary | ICD-10-CM | POA: Diagnosis not present

## 2016-05-29 DIAGNOSIS — Z23 Encounter for immunization: Secondary | ICD-10-CM | POA: Diagnosis not present

## 2016-05-29 DIAGNOSIS — I2510 Atherosclerotic heart disease of native coronary artery without angina pectoris: Secondary | ICD-10-CM | POA: Diagnosis not present

## 2016-05-29 DIAGNOSIS — E782 Mixed hyperlipidemia: Secondary | ICD-10-CM | POA: Diagnosis not present

## 2016-06-20 DIAGNOSIS — E119 Type 2 diabetes mellitus without complications: Secondary | ICD-10-CM | POA: Diagnosis not present

## 2016-06-27 DIAGNOSIS — G4733 Obstructive sleep apnea (adult) (pediatric): Secondary | ICD-10-CM | POA: Diagnosis not present

## 2016-08-14 DIAGNOSIS — E7800 Pure hypercholesterolemia, unspecified: Secondary | ICD-10-CM | POA: Diagnosis not present

## 2016-08-14 DIAGNOSIS — R739 Hyperglycemia, unspecified: Secondary | ICD-10-CM | POA: Diagnosis not present

## 2016-08-14 DIAGNOSIS — I10 Essential (primary) hypertension: Secondary | ICD-10-CM | POA: Diagnosis not present

## 2016-08-22 DIAGNOSIS — H66001 Acute suppurative otitis media without spontaneous rupture of ear drum, right ear: Secondary | ICD-10-CM | POA: Diagnosis not present

## 2016-08-22 DIAGNOSIS — J019 Acute sinusitis, unspecified: Secondary | ICD-10-CM | POA: Diagnosis not present

## 2016-08-23 DIAGNOSIS — R739 Hyperglycemia, unspecified: Secondary | ICD-10-CM | POA: Diagnosis not present

## 2016-08-23 DIAGNOSIS — Z9861 Coronary angioplasty status: Secondary | ICD-10-CM | POA: Diagnosis not present

## 2016-08-23 DIAGNOSIS — I2510 Atherosclerotic heart disease of native coronary artery without angina pectoris: Secondary | ICD-10-CM | POA: Diagnosis not present

## 2016-08-23 DIAGNOSIS — E7800 Pure hypercholesterolemia, unspecified: Secondary | ICD-10-CM | POA: Diagnosis not present

## 2016-09-11 DIAGNOSIS — J300 Vasomotor rhinitis: Secondary | ICD-10-CM | POA: Diagnosis not present

## 2016-09-11 DIAGNOSIS — H1045 Other chronic allergic conjunctivitis: Secondary | ICD-10-CM | POA: Diagnosis not present

## 2016-09-11 DIAGNOSIS — J4530 Mild persistent asthma, uncomplicated: Secondary | ICD-10-CM | POA: Diagnosis not present

## 2016-09-11 DIAGNOSIS — J019 Acute sinusitis, unspecified: Secondary | ICD-10-CM | POA: Diagnosis not present

## 2016-10-16 DIAGNOSIS — I10 Essential (primary) hypertension: Secondary | ICD-10-CM | POA: Diagnosis not present

## 2016-10-16 DIAGNOSIS — M722 Plantar fascial fibromatosis: Secondary | ICD-10-CM | POA: Diagnosis not present

## 2016-10-16 DIAGNOSIS — M7751 Other enthesopathy of right foot: Secondary | ICD-10-CM | POA: Diagnosis not present

## 2016-10-16 DIAGNOSIS — M24572 Contracture, left ankle: Secondary | ICD-10-CM | POA: Diagnosis not present

## 2016-10-16 DIAGNOSIS — M24571 Contracture, right ankle: Secondary | ICD-10-CM | POA: Diagnosis not present

## 2016-11-12 DIAGNOSIS — M7751 Other enthesopathy of right foot: Secondary | ICD-10-CM | POA: Diagnosis not present

## 2016-11-12 DIAGNOSIS — I10 Essential (primary) hypertension: Secondary | ICD-10-CM | POA: Diagnosis not present

## 2016-11-12 DIAGNOSIS — M722 Plantar fascial fibromatosis: Secondary | ICD-10-CM | POA: Diagnosis not present

## 2016-11-12 DIAGNOSIS — M7731 Calcaneal spur, right foot: Secondary | ICD-10-CM | POA: Diagnosis not present

## 2016-11-20 DIAGNOSIS — J4530 Mild persistent asthma, uncomplicated: Secondary | ICD-10-CM | POA: Diagnosis not present

## 2016-11-20 DIAGNOSIS — J209 Acute bronchitis, unspecified: Secondary | ICD-10-CM | POA: Diagnosis not present

## 2016-11-20 DIAGNOSIS — J019 Acute sinusitis, unspecified: Secondary | ICD-10-CM | POA: Diagnosis not present

## 2016-11-20 DIAGNOSIS — J300 Vasomotor rhinitis: Secondary | ICD-10-CM | POA: Diagnosis not present

## 2017-01-01 DIAGNOSIS — R7303 Prediabetes: Secondary | ICD-10-CM | POA: Diagnosis not present

## 2017-01-01 DIAGNOSIS — E782 Mixed hyperlipidemia: Secondary | ICD-10-CM | POA: Diagnosis not present

## 2017-01-04 DIAGNOSIS — J300 Vasomotor rhinitis: Secondary | ICD-10-CM | POA: Diagnosis not present

## 2017-01-04 DIAGNOSIS — J019 Acute sinusitis, unspecified: Secondary | ICD-10-CM | POA: Diagnosis not present

## 2017-01-04 DIAGNOSIS — J4530 Mild persistent asthma, uncomplicated: Secondary | ICD-10-CM | POA: Diagnosis not present

## 2017-01-04 DIAGNOSIS — J209 Acute bronchitis, unspecified: Secondary | ICD-10-CM | POA: Diagnosis not present

## 2017-01-24 ENCOUNTER — Other Ambulatory Visit: Payer: Self-pay | Admitting: Allergy

## 2017-01-24 ENCOUNTER — Ambulatory Visit
Admission: RE | Admit: 2017-01-24 | Discharge: 2017-01-24 | Disposition: A | Discharge status: Home / Self Care | Payer: BLUE CROSS/BLUE SHIELD | Source: Ambulatory Visit | Attending: Allergy | Admitting: Allergy

## 2017-01-24 DIAGNOSIS — H1045 Other chronic allergic conjunctivitis: Secondary | ICD-10-CM | POA: Diagnosis not present

## 2017-01-24 DIAGNOSIS — J209 Acute bronchitis, unspecified: Secondary | ICD-10-CM

## 2017-01-24 DIAGNOSIS — J019 Acute sinusitis, unspecified: Secondary | ICD-10-CM | POA: Diagnosis not present

## 2017-01-24 DIAGNOSIS — J300 Vasomotor rhinitis: Secondary | ICD-10-CM | POA: Diagnosis not present

## 2017-01-24 DIAGNOSIS — J4530 Mild persistent asthma, uncomplicated: Secondary | ICD-10-CM | POA: Diagnosis not present

## 2017-01-24 DIAGNOSIS — J45909 Unspecified asthma, uncomplicated: Secondary | ICD-10-CM | POA: Diagnosis not present

## 2017-03-01 DIAGNOSIS — E119 Type 2 diabetes mellitus without complications: Secondary | ICD-10-CM | POA: Diagnosis not present

## 2017-03-01 DIAGNOSIS — H401132 Primary open-angle glaucoma, bilateral, moderate stage: Secondary | ICD-10-CM | POA: Diagnosis not present

## 2017-03-08 DIAGNOSIS — M7751 Other enthesopathy of right foot: Secondary | ICD-10-CM | POA: Diagnosis not present

## 2017-03-08 DIAGNOSIS — M7741 Metatarsalgia, right foot: Secondary | ICD-10-CM | POA: Diagnosis not present

## 2017-03-08 DIAGNOSIS — I10 Essential (primary) hypertension: Secondary | ICD-10-CM | POA: Diagnosis not present

## 2017-04-08 DIAGNOSIS — J4530 Mild persistent asthma, uncomplicated: Secondary | ICD-10-CM | POA: Diagnosis not present

## 2017-04-08 DIAGNOSIS — H1045 Other chronic allergic conjunctivitis: Secondary | ICD-10-CM | POA: Diagnosis not present

## 2017-04-08 DIAGNOSIS — J300 Vasomotor rhinitis: Secondary | ICD-10-CM | POA: Diagnosis not present

## 2017-04-29 DIAGNOSIS — R7303 Prediabetes: Secondary | ICD-10-CM | POA: Diagnosis not present

## 2017-04-29 DIAGNOSIS — I2510 Atherosclerotic heart disease of native coronary artery without angina pectoris: Secondary | ICD-10-CM | POA: Diagnosis not present

## 2017-04-29 DIAGNOSIS — I10 Essential (primary) hypertension: Secondary | ICD-10-CM | POA: Diagnosis not present

## 2017-04-29 DIAGNOSIS — E782 Mixed hyperlipidemia: Secondary | ICD-10-CM | POA: Diagnosis not present

## 2017-05-01 DIAGNOSIS — E119 Type 2 diabetes mellitus without complications: Secondary | ICD-10-CM | POA: Diagnosis not present

## 2017-05-02 DIAGNOSIS — M79671 Pain in right foot: Secondary | ICD-10-CM | POA: Diagnosis not present

## 2017-05-02 DIAGNOSIS — E119 Type 2 diabetes mellitus without complications: Secondary | ICD-10-CM | POA: Diagnosis not present

## 2017-05-02 DIAGNOSIS — M722 Plantar fascial fibromatosis: Secondary | ICD-10-CM | POA: Diagnosis not present

## 2017-05-08 DIAGNOSIS — M79671 Pain in right foot: Secondary | ICD-10-CM | POA: Diagnosis not present

## 2017-05-08 DIAGNOSIS — E119 Type 2 diabetes mellitus without complications: Secondary | ICD-10-CM | POA: Diagnosis not present

## 2017-05-08 DIAGNOSIS — M722 Plantar fascial fibromatosis: Secondary | ICD-10-CM | POA: Diagnosis not present

## 2017-05-09 DIAGNOSIS — H401132 Primary open-angle glaucoma, bilateral, moderate stage: Secondary | ICD-10-CM | POA: Diagnosis not present

## 2017-05-22 DIAGNOSIS — M79671 Pain in right foot: Secondary | ICD-10-CM | POA: Diagnosis not present

## 2017-06-24 DIAGNOSIS — H401132 Primary open-angle glaucoma, bilateral, moderate stage: Secondary | ICD-10-CM | POA: Diagnosis not present

## 2017-07-25 DIAGNOSIS — I10 Essential (primary) hypertension: Secondary | ICD-10-CM | POA: Diagnosis not present

## 2017-07-25 DIAGNOSIS — R7303 Prediabetes: Secondary | ICD-10-CM | POA: Diagnosis not present

## 2017-07-25 DIAGNOSIS — Z125 Encounter for screening for malignant neoplasm of prostate: Secondary | ICD-10-CM | POA: Diagnosis not present

## 2017-07-25 DIAGNOSIS — H401132 Primary open-angle glaucoma, bilateral, moderate stage: Secondary | ICD-10-CM | POA: Diagnosis not present

## 2017-07-25 DIAGNOSIS — E782 Mixed hyperlipidemia: Secondary | ICD-10-CM | POA: Diagnosis not present

## 2017-07-29 DIAGNOSIS — Z0000 Encounter for general adult medical examination without abnormal findings: Secondary | ICD-10-CM | POA: Diagnosis not present

## 2017-07-29 DIAGNOSIS — I2510 Atherosclerotic heart disease of native coronary artery without angina pectoris: Secondary | ICD-10-CM | POA: Diagnosis not present

## 2017-07-29 DIAGNOSIS — R7303 Prediabetes: Secondary | ICD-10-CM | POA: Diagnosis not present

## 2017-07-29 DIAGNOSIS — E782 Mixed hyperlipidemia: Secondary | ICD-10-CM | POA: Diagnosis not present

## 2017-07-29 DIAGNOSIS — Z23 Encounter for immunization: Secondary | ICD-10-CM | POA: Diagnosis not present

## 2017-08-14 DIAGNOSIS — I10 Essential (primary) hypertension: Secondary | ICD-10-CM | POA: Diagnosis not present

## 2017-08-14 DIAGNOSIS — I2510 Atherosclerotic heart disease of native coronary artery without angina pectoris: Secondary | ICD-10-CM | POA: Diagnosis not present

## 2017-08-14 DIAGNOSIS — E7800 Pure hypercholesterolemia, unspecified: Secondary | ICD-10-CM | POA: Diagnosis not present

## 2017-08-14 DIAGNOSIS — Z9861 Coronary angioplasty status: Secondary | ICD-10-CM | POA: Diagnosis not present

## 2017-08-29 DIAGNOSIS — I10 Essential (primary) hypertension: Secondary | ICD-10-CM | POA: Diagnosis not present

## 2017-08-29 DIAGNOSIS — I2510 Atherosclerotic heart disease of native coronary artery without angina pectoris: Secondary | ICD-10-CM | POA: Diagnosis not present

## 2017-09-04 DIAGNOSIS — J300 Vasomotor rhinitis: Secondary | ICD-10-CM | POA: Diagnosis not present

## 2017-09-04 DIAGNOSIS — J4530 Mild persistent asthma, uncomplicated: Secondary | ICD-10-CM | POA: Diagnosis not present

## 2017-09-04 DIAGNOSIS — H1045 Other chronic allergic conjunctivitis: Secondary | ICD-10-CM | POA: Diagnosis not present

## 2017-12-12 DIAGNOSIS — H401132 Primary open-angle glaucoma, bilateral, moderate stage: Secondary | ICD-10-CM | POA: Diagnosis not present

## 2017-12-12 DIAGNOSIS — E119 Type 2 diabetes mellitus without complications: Secondary | ICD-10-CM | POA: Diagnosis not present

## 2017-12-31 DIAGNOSIS — R7303 Prediabetes: Secondary | ICD-10-CM | POA: Diagnosis not present

## 2017-12-31 DIAGNOSIS — I2510 Atherosclerotic heart disease of native coronary artery without angina pectoris: Secondary | ICD-10-CM | POA: Diagnosis not present

## 2017-12-31 DIAGNOSIS — E782 Mixed hyperlipidemia: Secondary | ICD-10-CM | POA: Diagnosis not present

## 2018-01-07 DIAGNOSIS — E782 Mixed hyperlipidemia: Secondary | ICD-10-CM | POA: Diagnosis not present

## 2018-01-07 DIAGNOSIS — I2510 Atherosclerotic heart disease of native coronary artery without angina pectoris: Secondary | ICD-10-CM | POA: Diagnosis not present

## 2018-01-07 DIAGNOSIS — R7303 Prediabetes: Secondary | ICD-10-CM | POA: Diagnosis not present

## 2018-01-07 DIAGNOSIS — I10 Essential (primary) hypertension: Secondary | ICD-10-CM | POA: Diagnosis not present

## 2018-02-20 DIAGNOSIS — B359 Dermatophytosis, unspecified: Secondary | ICD-10-CM | POA: Diagnosis not present

## 2018-03-11 DIAGNOSIS — J4530 Mild persistent asthma, uncomplicated: Secondary | ICD-10-CM | POA: Diagnosis not present

## 2018-03-11 DIAGNOSIS — H1045 Other chronic allergic conjunctivitis: Secondary | ICD-10-CM | POA: Diagnosis not present

## 2018-03-11 DIAGNOSIS — J300 Vasomotor rhinitis: Secondary | ICD-10-CM | POA: Diagnosis not present

## 2018-03-18 DIAGNOSIS — H401132 Primary open-angle glaucoma, bilateral, moderate stage: Secondary | ICD-10-CM | POA: Diagnosis not present

## 2018-04-10 DIAGNOSIS — R21 Rash and other nonspecific skin eruption: Secondary | ICD-10-CM | POA: Diagnosis not present

## 2018-04-10 DIAGNOSIS — R7303 Prediabetes: Secondary | ICD-10-CM | POA: Diagnosis not present

## 2018-04-10 DIAGNOSIS — B359 Dermatophytosis, unspecified: Secondary | ICD-10-CM | POA: Diagnosis not present

## 2018-04-14 DIAGNOSIS — L309 Dermatitis, unspecified: Secondary | ICD-10-CM | POA: Diagnosis not present

## 2018-05-07 DIAGNOSIS — H401132 Primary open-angle glaucoma, bilateral, moderate stage: Secondary | ICD-10-CM | POA: Diagnosis not present

## 2018-06-09 DIAGNOSIS — J209 Acute bronchitis, unspecified: Secondary | ICD-10-CM | POA: Diagnosis not present

## 2018-06-17 DIAGNOSIS — J209 Acute bronchitis, unspecified: Secondary | ICD-10-CM | POA: Diagnosis not present

## 2018-06-17 DIAGNOSIS — H1045 Other chronic allergic conjunctivitis: Secondary | ICD-10-CM | POA: Diagnosis not present

## 2018-06-17 DIAGNOSIS — J300 Vasomotor rhinitis: Secondary | ICD-10-CM | POA: Diagnosis not present

## 2018-06-17 DIAGNOSIS — J4530 Mild persistent asthma, uncomplicated: Secondary | ICD-10-CM | POA: Diagnosis not present

## 2018-06-17 DIAGNOSIS — J019 Acute sinusitis, unspecified: Secondary | ICD-10-CM | POA: Diagnosis not present

## 2018-08-31 IMAGING — CR DG CHEST 2V
2 series · 2 of 2 positions shown · non-contrast
Comparison: Chest x-ray of October 25, 2012

CLINICAL DATA: One-week history of bronchitis symptoms. No fever.
History of asthma, previous MI, nonsmoker.

EXAM:
CHEST  2 VIEW

[w chest pa]
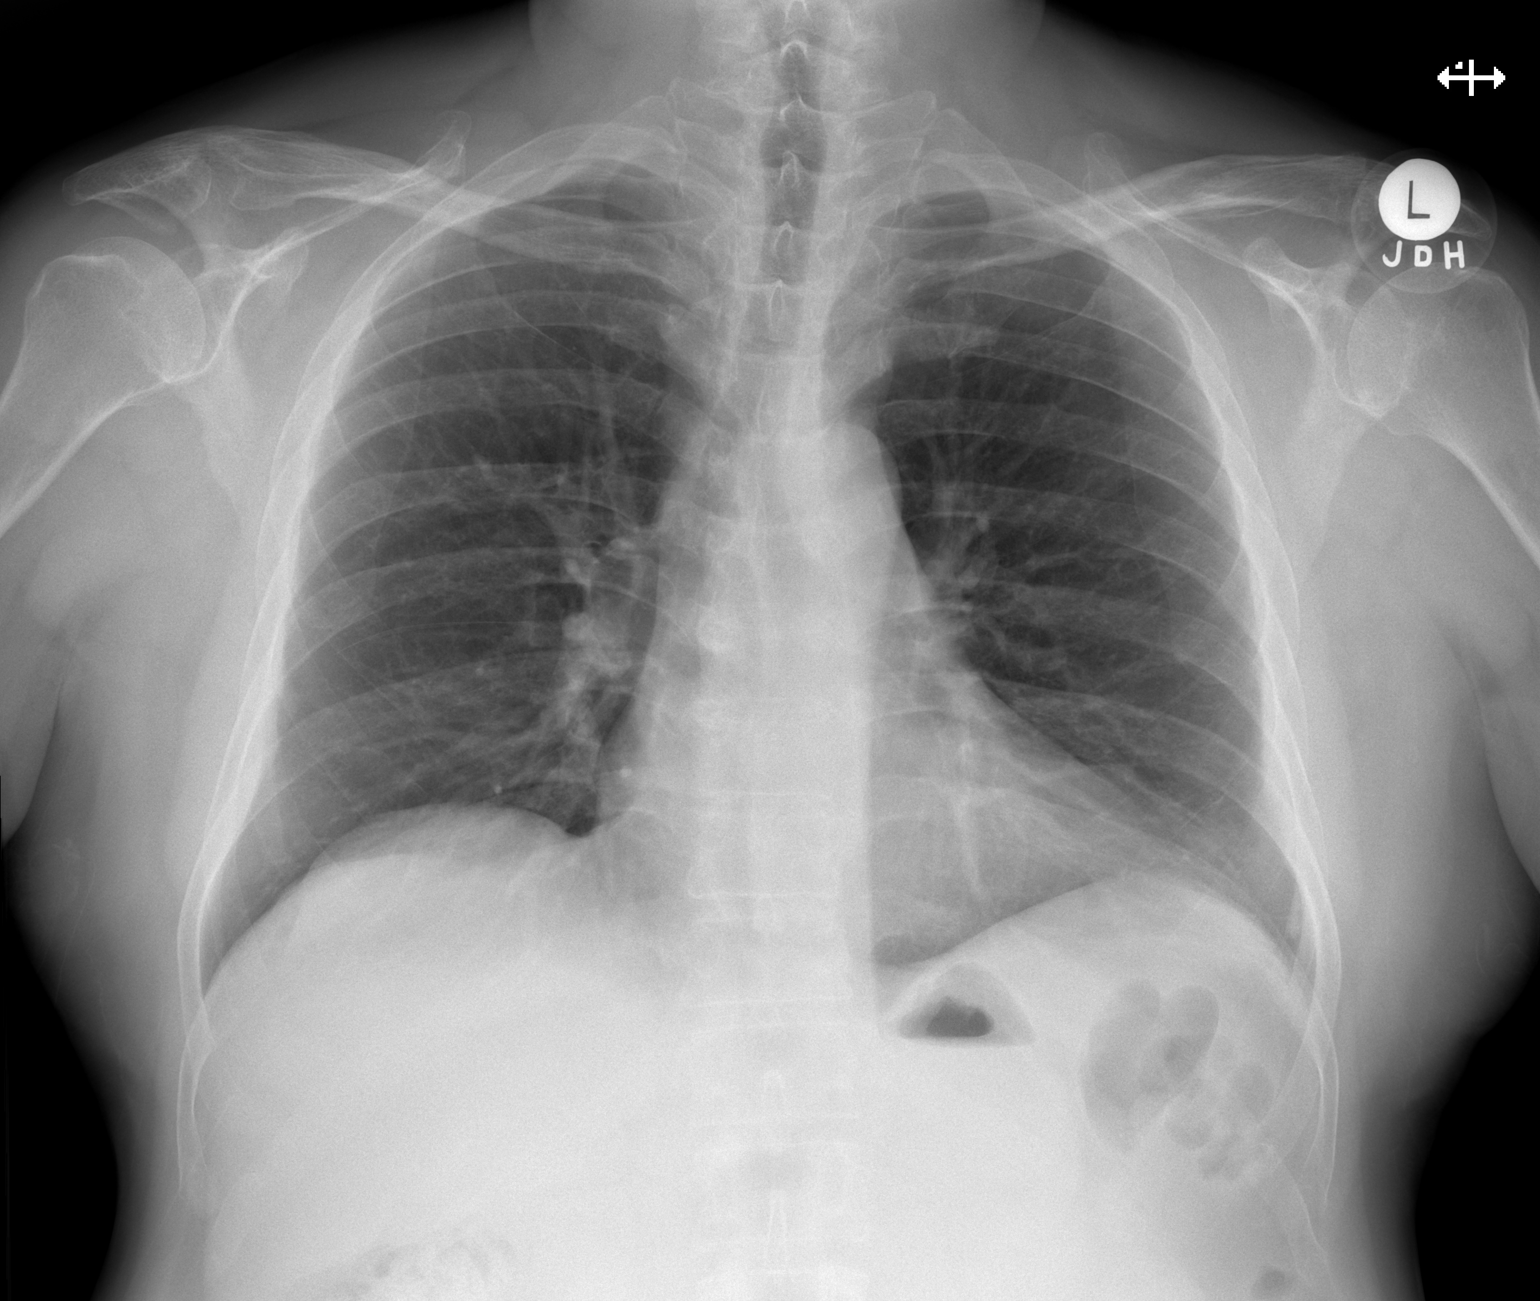

[w chest lat]
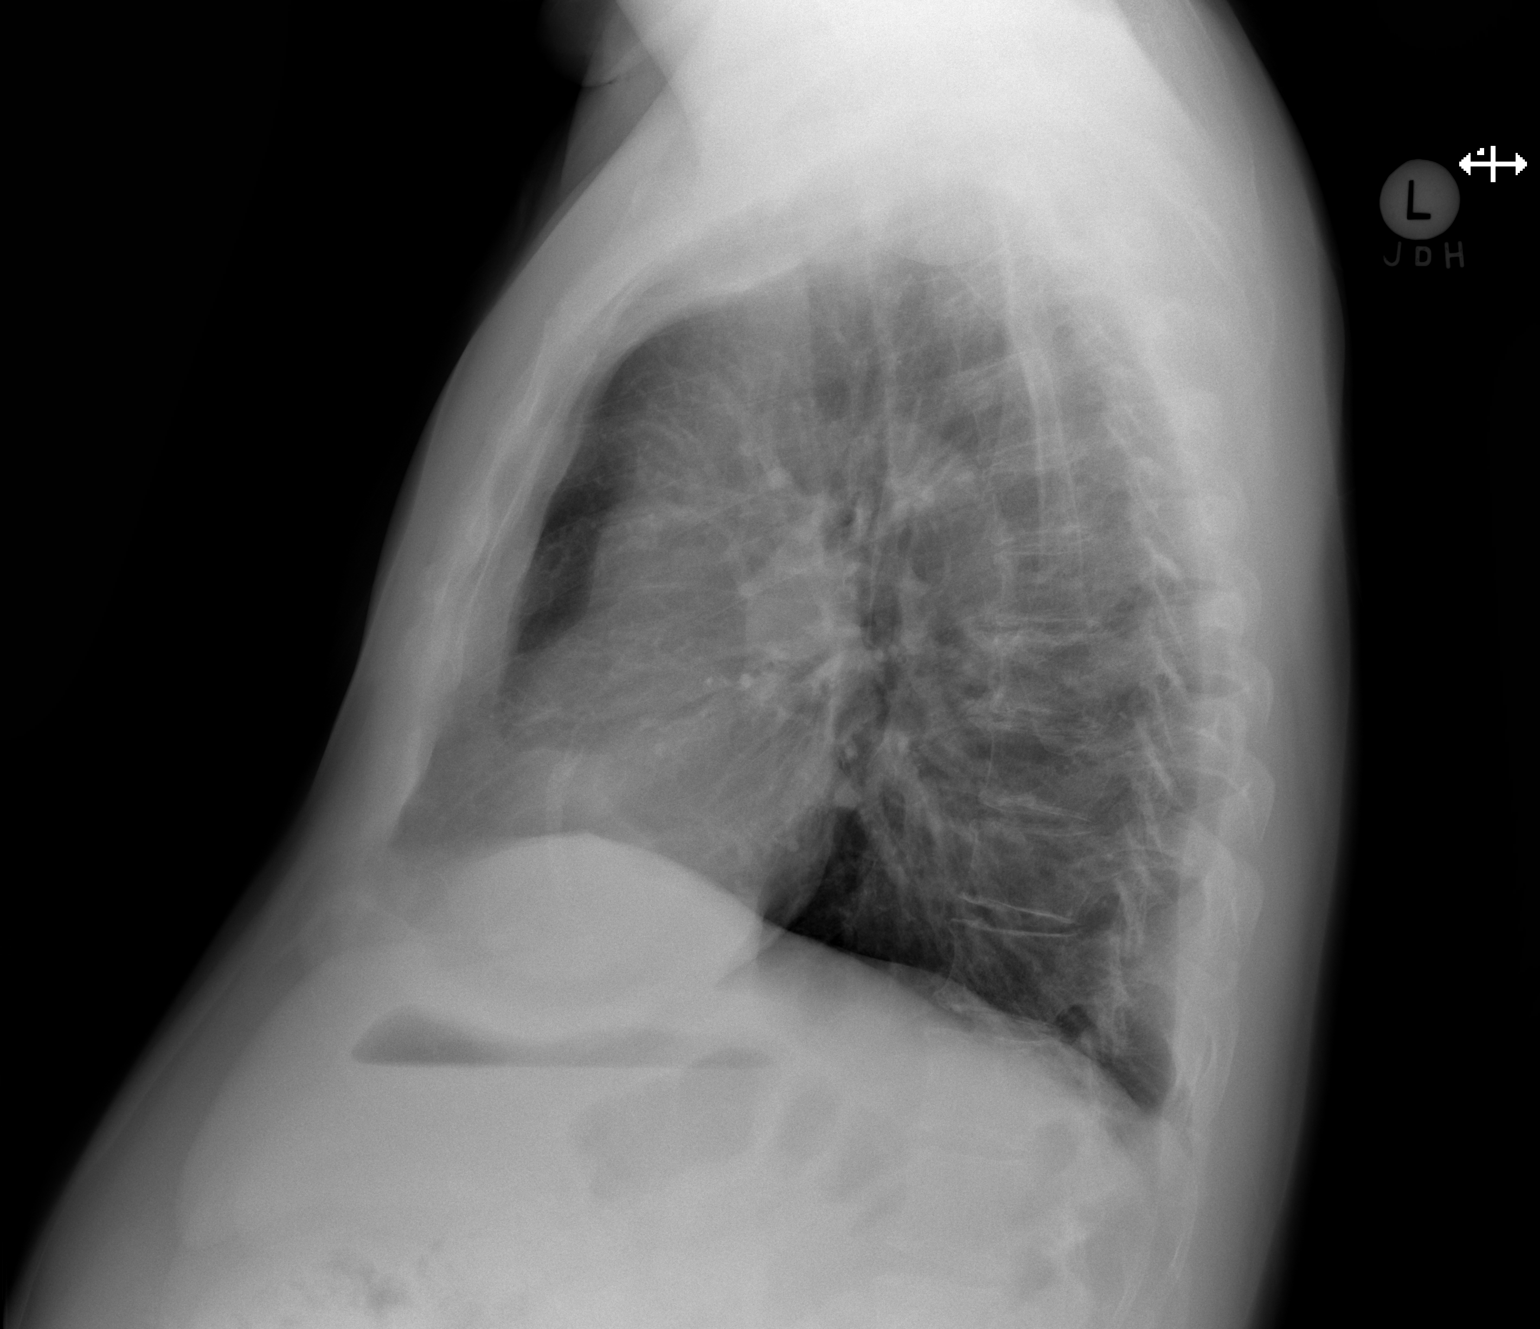

[2 of 2 positions shown; findings below may reference images not displayed]

FINDINGS: The lungs are well-expanded and clear. The heart and pulmonary
vascularity are normal. The mediastinum is normal in width. There is
no pleural effusion. The bony thorax exhibits no acute abnormality.
IMPRESSION: There is no pneumonia nor other acute cardiopulmonary abnormality.

## 2018-09-03 DIAGNOSIS — J300 Vasomotor rhinitis: Secondary | ICD-10-CM | POA: Diagnosis not present

## 2018-09-03 DIAGNOSIS — H1045 Other chronic allergic conjunctivitis: Secondary | ICD-10-CM | POA: Diagnosis not present

## 2018-09-03 DIAGNOSIS — J4530 Mild persistent asthma, uncomplicated: Secondary | ICD-10-CM | POA: Diagnosis not present

## 2018-09-04 DIAGNOSIS — H401132 Primary open-angle glaucoma, bilateral, moderate stage: Secondary | ICD-10-CM | POA: Diagnosis not present

## 2018-09-12 DIAGNOSIS — Z9861 Coronary angioplasty status: Secondary | ICD-10-CM | POA: Diagnosis not present

## 2018-09-12 DIAGNOSIS — E7800 Pure hypercholesterolemia, unspecified: Secondary | ICD-10-CM | POA: Diagnosis not present

## 2018-09-12 DIAGNOSIS — I2510 Atherosclerotic heart disease of native coronary artery without angina pectoris: Secondary | ICD-10-CM | POA: Diagnosis not present

## 2018-09-12 DIAGNOSIS — I10 Essential (primary) hypertension: Secondary | ICD-10-CM | POA: Diagnosis not present

## 2018-09-18 DIAGNOSIS — R7303 Prediabetes: Secondary | ICD-10-CM | POA: Diagnosis not present

## 2018-09-18 DIAGNOSIS — E782 Mixed hyperlipidemia: Secondary | ICD-10-CM | POA: Diagnosis not present

## 2018-09-23 DIAGNOSIS — R7303 Prediabetes: Secondary | ICD-10-CM | POA: Diagnosis not present

## 2018-09-23 DIAGNOSIS — I10 Essential (primary) hypertension: Secondary | ICD-10-CM | POA: Diagnosis not present

## 2018-09-23 DIAGNOSIS — Z23 Encounter for immunization: Secondary | ICD-10-CM | POA: Diagnosis not present

## 2018-09-23 DIAGNOSIS — E782 Mixed hyperlipidemia: Secondary | ICD-10-CM | POA: Diagnosis not present

## 2018-09-23 DIAGNOSIS — Z0000 Encounter for general adult medical examination without abnormal findings: Secondary | ICD-10-CM | POA: Diagnosis not present

## 2018-10-14 DIAGNOSIS — G4733 Obstructive sleep apnea (adult) (pediatric): Secondary | ICD-10-CM | POA: Diagnosis not present

## 2018-11-05 ENCOUNTER — Other Ambulatory Visit: Payer: Self-pay | Admitting: Cardiology

## 2018-11-05 DIAGNOSIS — I10 Essential (primary) hypertension: Secondary | ICD-10-CM

## 2019-03-27 DIAGNOSIS — H1045 Other chronic allergic conjunctivitis: Secondary | ICD-10-CM | POA: Diagnosis not present

## 2019-03-27 DIAGNOSIS — J4530 Mild persistent asthma, uncomplicated: Secondary | ICD-10-CM | POA: Diagnosis not present

## 2019-03-27 DIAGNOSIS — J300 Vasomotor rhinitis: Secondary | ICD-10-CM | POA: Diagnosis not present

## 2019-04-28 DIAGNOSIS — I10 Essential (primary) hypertension: Secondary | ICD-10-CM | POA: Diagnosis not present

## 2019-04-28 DIAGNOSIS — R7303 Prediabetes: Secondary | ICD-10-CM | POA: Diagnosis not present

## 2019-04-28 DIAGNOSIS — E782 Mixed hyperlipidemia: Secondary | ICD-10-CM | POA: Diagnosis not present

## 2019-05-05 DIAGNOSIS — I10 Essential (primary) hypertension: Secondary | ICD-10-CM | POA: Diagnosis not present

## 2019-05-05 DIAGNOSIS — E782 Mixed hyperlipidemia: Secondary | ICD-10-CM | POA: Diagnosis not present

## 2019-05-05 DIAGNOSIS — N4829 Other inflammatory disorders of penis: Secondary | ICD-10-CM | POA: Diagnosis not present

## 2019-05-05 DIAGNOSIS — I2510 Atherosclerotic heart disease of native coronary artery without angina pectoris: Secondary | ICD-10-CM | POA: Diagnosis not present

## 2019-07-17 DIAGNOSIS — J4530 Mild persistent asthma, uncomplicated: Secondary | ICD-10-CM | POA: Diagnosis not present

## 2019-07-17 DIAGNOSIS — J300 Vasomotor rhinitis: Secondary | ICD-10-CM | POA: Diagnosis not present

## 2019-07-17 DIAGNOSIS — H1045 Other chronic allergic conjunctivitis: Secondary | ICD-10-CM | POA: Diagnosis not present

## 2019-07-31 ENCOUNTER — Other Ambulatory Visit: Payer: Self-pay

## 2019-07-31 ENCOUNTER — Encounter: Payer: Self-pay | Admitting: Cardiology

## 2019-07-31 ENCOUNTER — Ambulatory Visit (INDEPENDENT_AMBULATORY_CARE_PROVIDER_SITE_OTHER): Payer: BLUE CROSS/BLUE SHIELD | Admitting: Cardiology

## 2019-07-31 VITALS — BP 131/75 | HR 100 | Ht 65.0 in | Wt 146.8 lb

## 2019-07-31 DIAGNOSIS — I2510 Atherosclerotic heart disease of native coronary artery without angina pectoris: Secondary | ICD-10-CM | POA: Diagnosis not present

## 2019-07-31 DIAGNOSIS — J45909 Unspecified asthma, uncomplicated: Secondary | ICD-10-CM | POA: Insufficient documentation

## 2019-07-31 DIAGNOSIS — I10 Essential (primary) hypertension: Secondary | ICD-10-CM | POA: Diagnosis not present

## 2019-07-31 DIAGNOSIS — R739 Hyperglycemia, unspecified: Secondary | ICD-10-CM | POA: Insufficient documentation

## 2019-07-31 DIAGNOSIS — E7800 Pure hypercholesterolemia, unspecified: Secondary | ICD-10-CM

## 2019-07-31 DIAGNOSIS — T7840XA Allergy, unspecified, initial encounter: Secondary | ICD-10-CM | POA: Insufficient documentation

## 2019-07-31 NOTE — Progress Notes (Signed)
Primary Physician/Referring:  Merrilee Seashore, MD  Patient ID: George Knight, male    DOB: 28-Apr-1956, 63 y.o.   MRN: [id number]  Chief Complaint  Patient presents with  . Coronary Artery Disease  . Follow-up   HPI:    George Knight  is a 63 y.o. [place] male with coronary artery disease and myocardial infarction in 2005 and has a stent in his right coronary artery, prediabetes, hypertension and hyperlipidemia presents here for 1 year follow-up. He has been feeling well. No complaints of chest pain, tightness or pressure. No shortness of breath, orthopnea or PND. He has allergic asthma.   No symptoms of TIA. No claudication, leg edema, painful swelling of the lower extremities, palpitations or syncope. Denies any neurological deficits. He has not used any sublingual nitroglycerin in past 1 year.  Patient is tolerating all his medications, exercises regularly. Has occasional bouts of asthma.   Past Medical History:  Diagnosis Date  . Asthma   . High cholesterol   . Hyperlipidemia   . Hypertension   . MI (myocardial infarction) [facility])    Past Surgical History:  Procedure Laterality Date  . CORONARY STENT PLACEMENT     Social History   Socioeconomic History  . Marital status: Married    Spouse name: Ranjan  . Number of children: 2  . Years of education: degree  . Highest education level: Not on file  Occupational History    Comment: hospitality  Social Needs  . Financial resource strain: Not on file  . Food insecurity    Worry: Not on file    Inability: Not on file  . Transportation needs    Medical: Not on file    Non-medical: Not on file  Tobacco Use  . Smoking status: Never Smoker  . Smokeless tobacco: Former Systems developer    Types: Chew  . Tobacco comment: quit 2005  Substance and Sexual Activity  . Alcohol use: Yes    Alcohol/week: 0.0 standard drinks    Comment: occas. beer  . Drug use: No  . Sexual activity: Not on file  Lifestyle  . Physical  activity    Days per week: Not on file    Minutes per session: Not on file  . Stress: Not on file  Relationships  . Social Herbalist on phone: Not on file    Gets together: Not on file    Attends religious service: Not on file    Active member of club or organization: Not on file    Attends meetings of clubs or organizations: Not on file    Relationship status: Not on file  . Intimate partner violence    Fear of current or ex partner: Not on file    Emotionally abused: Not on file    Physically abused: Not on file    Forced sexual activity: Not on file  Other Topics Concern  . Not on file  Social History Narrative   Patient is consumes occas. caffeine   ROS  Review of Systems  Constitution: Negative for chills, decreased appetite, malaise/fatigue and weight gain.  Cardiovascular: Negative for leg swelling and syncope.  Respiratory: Positive for shortness of breath and wheezing (occasional).   Endocrine: Negative for cold intolerance.  Hematologic/Lymphatic: Does not bruise/bleed easily.  Musculoskeletal: Negative for joint swelling.  Gastrointestinal: Negative for abdominal pain, anorexia, change in bowel habit, hematochezia and melena.  Neurological: Negative for headaches and light-headedness.  Psychiatric/Behavioral: Negative for depression and substance abuse.  All other systems reviewed and are negative.  Objective   Vitals with BMI 07/31/2019 10/14/2014 08/24/2014  Height 5' 5" 5' 4" 5' 4"  Weight 146 lbs 13 oz 153 lbs 153 lbs  BMI 24.43 26.3 26.3  Systolic 131 159 131  Diastolic 75 86 80  Pulse 100 74 79      Physical Exam  Constitutional:  Moderately built and well nourished. Trunkal obesity present  HENT:  Head: Atraumatic.  Eyes: Conjunctivae are normal.  Neck: Neck supple. No JVD present. No thyromegaly present.  Cardiovascular: Normal rate, regular rhythm, normal heart sounds and intact distal pulses. Exam reveals no gallop.  No murmur heard.  No leg edema, no JVD.  Pulmonary/Chest: Effort normal and breath sounds normal.  Abdominal: Soft. Bowel sounds are normal.  Musculoskeletal: Normal range of motion.  Neurological: He is alert.  Skin: Skin is warm and dry.  Psychiatric: He has a normal mood and affect.   Laboratory examination:   No results for input(s): NA, K, CL, CO2, GLUCOSE, BUN, CREATININE, CALCIUM, GFRNONAA, GFRAA in the last 8760 hours. CrCl cannot be calculated (Patient's most recent lab result is older than the maximum 21 days allowed.).  CMP Latest Ref Rng & Units 10/25/2012  Glucose 70 - 99 mg/dL 108(H)  BUN 6 - 23 mg/dL 19  Creatinine 0.50 - 1.35 mg/dL 0.93  Sodium 135 - 145 mEq/L 140  Potassium 3.5 - 5.1 mEq/L 4.0  Chloride 96 - 112 mEq/L 105  CO2 19 - 32 mEq/L 23  Calcium 8.4 - 10.5 mg/dL 9.5   CBC Latest Ref Rng & Units 10/25/2012  WBC 4.0 - 10.5 K/uL 20.0(H)  Hemoglobin 13.0 - 17.0 g/dL 14.3  Hematocrit 39.0 - 52.0 % 42.9  Platelets 150 - 400 K/uL 314   Lipid Panel  No results found for: CHOL, TRIG, HDL, CHOLHDL, VLDL, LDLCALC, LDLDIRECT HEMOGLOBIN A1C No results found for: HGBA1C, MPG TSH No results for input(s): TSH in the last 8760 hours. Medications and allergies  No Known Allergies   Current Outpatient Medications  Medication Instructions  . amoxicillin (AMOXIL) 500 mg, Oral, 2 times daily, 5 days   . aspirin EC 162 mg, Daily at bedtime  . azelastine (ASTELIN) 0.1 % nasal spray Each Nare, Daily, Use in each nostril as directed   . azelastine (OPTIVAR) 0.05 % ophthalmic solution 1 drop, 2 times daily  . brimonidine (ALPHAGAN) 0.15 % ophthalmic solution 1 drop, 2 times daily  . budesonide-formoterol (SYMBICORT) 160-4.5 MCG/ACT inhaler 2 puffs, 2 times daily  . levalbuterol (XOPENEX HFA) 45 MCG/ACT inhaler 1-2 puffs, Inhalation, Every 4 hours PRN  . levocetirizine (XYZAL) 5 mg, Daily at bedtime  . losartan (COZAAR) 100 mg, Oral, Daily, 100 mg daily  . montelukast (SINGULAIR) 10 mg, Daily  at bedtime  . nitroGLYCERIN (NITROSTAT) 0.4 mg, Every 5 min PRN  . OVER THE COUNTER MEDICATION 1 tablet, Oral, Once in a while  . predniSONE (DELTASONE) 8 mg, Oral, Daily, Tapering dose beginning 10/17/12,Has 3 days remaining  . simvastatin (ZOCOR) 20 mg, Oral, Every evening, 20 mg daily    Radiology:  No results found. Cardiac Studies:   Coronary angiogram 07/16/2004: Acute inferior MI. S/P mid RCA stent with 3.5 x 18 mm Cypher. 30% stenosis proximal RCA. Mild luminal irregularity involving LAD.- [facility].  Treadmill Stress 03/05/2014: Indications: Assessment of Chest Pain. Conclusions: Negative for ischemia. The patient exercised according to the Bruce protocol, Total time recorded 9 Min. 31 sec. achieving a max heart rate   of 154 which was 94% of MPHR for age and 10.5 METS of work. During exercise there was no ST-T changes of ischemia. Symptoms: THR met. Mild dyspnea. Arrhythmia: None. Excellent effort. Continue secondary prevention.   ABI 08/29/2017: This exam reveals mildly decreased perfusion of the lower extremity, RABI 0.92 and LABI 0.84 noted at the post tibial artery level (ABI).  Assessment     ICD-10-CM   1. Coronary artery disease involving native coronary artery of native heart without angina pectoris  I25.10 EKG 12-Lead  2. Essential hypertension  I10   3. Hypercholesteremia  E78.00     EKG 07/31/2019: Normal sinus rhythm at the rate of 92 bpm, borderline left atrial abnormality, incomplete right bundle branch block.  No evidence of ischemia.  Otherwise normal EKG. No significant change from  09/12/2018.   Recommendations:  No orders of the defined types were placed in this encounter.   George Knight  is a 63 y.o.  Asian [place] male with coronary artery disease and myocardial infarction in 2005 and has a stent in his right coronary artery, prediabetes, hypertension, hyperlipidemia and allergic asthma. Annual visit.   He is presently doing well, no recurrence of  angina pectoris, shortness of breath has been is issue but states that overall stable.  Blood pressure is well controlled.  Lipids being managed by his PCP, has prediabetes not on any therapy for now, tolerating statins well and he has gained some weight since Covid 19 but plans to continue to exercise and watch his diet.  No changes in his EKG, I will see him back in a year or sooner if problems.  George Prows, MD, [facility] 07/31/2019, 3:54 PM [facility] [place] Pager: [phone] Office: [phone] If no answer Cell [phone]

## 2019-08-01 ENCOUNTER — Encounter: Payer: Self-pay | Admitting: Cardiology

## 2019-08-12 ENCOUNTER — Ambulatory Visit: Payer: BLUE CROSS/BLUE SHIELD | Admitting: Cardiology

## 2019-08-31 ENCOUNTER — Other Ambulatory Visit: Payer: Self-pay

## 2019-08-31 MED ORDER — LOSARTAN POTASSIUM 100 MG PO TABS: 100.0000 mg | ORAL_TABLET | Freq: Every day | ORAL | 4 refills | Status: DC

## 2019-09-02 DIAGNOSIS — H401134 Primary open-angle glaucoma, bilateral, indeterminate stage: Secondary | ICD-10-CM | POA: Diagnosis not present

## 2019-09-02 DIAGNOSIS — E119 Type 2 diabetes mellitus without complications: Secondary | ICD-10-CM | POA: Diagnosis not present

## 2019-09-03 DIAGNOSIS — H401134 Primary open-angle glaucoma, bilateral, indeterminate stage: Secondary | ICD-10-CM | POA: Diagnosis not present

## 2019-09-08 ENCOUNTER — Other Ambulatory Visit: Payer: Self-pay

## 2019-09-08 MED ORDER — ATORVASTATIN CALCIUM 20 MG PO TABS: 20.0000 mg | ORAL_TABLET | Freq: Every day | ORAL | 1 refills | Status: DC

## 2019-09-16 DIAGNOSIS — H401134 Primary open-angle glaucoma, bilateral, indeterminate stage: Secondary | ICD-10-CM | POA: Diagnosis not present

## 2019-09-30 DIAGNOSIS — J300 Vasomotor rhinitis: Secondary | ICD-10-CM | POA: Diagnosis not present

## 2019-09-30 DIAGNOSIS — J4530 Mild persistent asthma, uncomplicated: Secondary | ICD-10-CM | POA: Diagnosis not present

## 2019-09-30 DIAGNOSIS — H1045 Other chronic allergic conjunctivitis: Secondary | ICD-10-CM | POA: Diagnosis not present

## 2019-10-13 DIAGNOSIS — E782 Mixed hyperlipidemia: Secondary | ICD-10-CM | POA: Diagnosis not present

## 2019-10-13 DIAGNOSIS — I2510 Atherosclerotic heart disease of native coronary artery without angina pectoris: Secondary | ICD-10-CM | POA: Diagnosis not present

## 2019-10-13 DIAGNOSIS — Z125 Encounter for screening for malignant neoplasm of prostate: Secondary | ICD-10-CM | POA: Diagnosis not present

## 2019-10-13 DIAGNOSIS — I10 Essential (primary) hypertension: Secondary | ICD-10-CM | POA: Diagnosis not present

## 2019-10-13 DIAGNOSIS — R7303 Prediabetes: Secondary | ICD-10-CM | POA: Diagnosis not present

## 2019-10-20 DIAGNOSIS — I10 Essential (primary) hypertension: Secondary | ICD-10-CM | POA: Diagnosis not present

## 2019-10-20 DIAGNOSIS — N4829 Other inflammatory disorders of penis: Secondary | ICD-10-CM | POA: Diagnosis not present

## 2019-10-20 DIAGNOSIS — E782 Mixed hyperlipidemia: Secondary | ICD-10-CM | POA: Diagnosis not present

## 2019-10-20 DIAGNOSIS — R7301 Impaired fasting glucose: Secondary | ICD-10-CM | POA: Diagnosis not present

## 2019-10-20 DIAGNOSIS — Z0000 Encounter for general adult medical examination without abnormal findings: Secondary | ICD-10-CM | POA: Diagnosis not present

## 2019-12-17 DIAGNOSIS — Z79899 Other long term (current) drug therapy: Secondary | ICD-10-CM | POA: Diagnosis not present

## 2019-12-17 DIAGNOSIS — H401134 Primary open-angle glaucoma, bilateral, indeterminate stage: Secondary | ICD-10-CM | POA: Diagnosis not present

## 2020-01-20 DIAGNOSIS — H524 Presbyopia: Secondary | ICD-10-CM | POA: Diagnosis not present

## 2020-01-20 DIAGNOSIS — H52203 Unspecified astigmatism, bilateral: Secondary | ICD-10-CM | POA: Diagnosis not present

## 2020-01-20 DIAGNOSIS — H5203 Hypermetropia, bilateral: Secondary | ICD-10-CM | POA: Diagnosis not present

## 2020-03-08 ENCOUNTER — Other Ambulatory Visit: Payer: Self-pay | Admitting: Cardiology

## 2020-03-25 DIAGNOSIS — E119 Type 2 diabetes mellitus without complications: Secondary | ICD-10-CM | POA: Diagnosis not present

## 2020-03-25 DIAGNOSIS — M722 Plantar fascial fibromatosis: Secondary | ICD-10-CM | POA: Diagnosis not present

## 2020-03-25 DIAGNOSIS — M79671 Pain in right foot: Secondary | ICD-10-CM | POA: Diagnosis not present

## 2020-03-25 DIAGNOSIS — M2142 Flat foot [pes planus] (acquired), left foot: Secondary | ICD-10-CM | POA: Diagnosis not present

## 2020-04-21 DIAGNOSIS — Z79899 Other long term (current) drug therapy: Secondary | ICD-10-CM | POA: Diagnosis not present

## 2020-04-21 DIAGNOSIS — H401134 Primary open-angle glaucoma, bilateral, indeterminate stage: Secondary | ICD-10-CM | POA: Diagnosis not present

## 2020-05-02 DIAGNOSIS — I10 Essential (primary) hypertension: Secondary | ICD-10-CM | POA: Diagnosis not present

## 2020-05-02 DIAGNOSIS — E782 Mixed hyperlipidemia: Secondary | ICD-10-CM | POA: Diagnosis not present

## 2020-05-02 DIAGNOSIS — R7301 Impaired fasting glucose: Secondary | ICD-10-CM | POA: Diagnosis not present

## 2020-05-16 DIAGNOSIS — Z20822 Contact with and (suspected) exposure to covid-19: Secondary | ICD-10-CM | POA: Diagnosis not present

## 2020-05-24 DIAGNOSIS — R7303 Prediabetes: Secondary | ICD-10-CM | POA: Diagnosis not present

## 2020-05-24 DIAGNOSIS — I2510 Atherosclerotic heart disease of native coronary artery without angina pectoris: Secondary | ICD-10-CM | POA: Diagnosis not present

## 2020-05-24 DIAGNOSIS — E782 Mixed hyperlipidemia: Secondary | ICD-10-CM | POA: Diagnosis not present

## 2020-05-24 DIAGNOSIS — I10 Essential (primary) hypertension: Secondary | ICD-10-CM | POA: Diagnosis not present

## 2020-06-29 ENCOUNTER — Ambulatory Visit: Payer: BC Managed Care – PPO | Admitting: Cardiology

## 2020-06-29 ENCOUNTER — Encounter: Payer: Self-pay | Admitting: Cardiology

## 2020-06-29 ENCOUNTER — Other Ambulatory Visit: Payer: Self-pay

## 2020-06-29 VITALS — BP 105/68 | HR 76 | Resp 16 | Ht 65.0 in | Wt 147.0 lb

## 2020-06-29 DIAGNOSIS — I2510 Atherosclerotic heart disease of native coronary artery without angina pectoris: Secondary | ICD-10-CM | POA: Diagnosis not present

## 2020-06-29 DIAGNOSIS — I10 Essential (primary) hypertension: Secondary | ICD-10-CM

## 2020-06-29 DIAGNOSIS — E7800 Pure hypercholesterolemia, unspecified: Secondary | ICD-10-CM | POA: Diagnosis not present

## 2020-06-29 MED ORDER — ICOSAPENT ETHYL 1 G PO CAPS: 1.0000 g | ORAL_CAPSULE | Freq: Two times a day (BID) | ORAL | 3 refills | Status: DC

## 2020-06-29 NOTE — Progress Notes (Signed)
Primary Physician/Referring:  Merrilee Seashore, MD  Patient ID: George Knight, male    DOB: [birth year], 64 y.o.   MRN: [id number]  Chief Complaint  Patient presents with   Coronary Artery Disease   Follow-up    1 year    HPI:    Gurjot Brisco  is a 64 y.o. Asian [place] male with coronary artery disease and myocardial infarction in 2005 and has a stent in his right coronary artery, prediabetes, hypertension and hyperlipidemia presents here for 1 year follow-up.  The patient has been doing well. He is still exercising regularly by [place]. He has no difficulty with this. Denies any chest discomfort, dyspnea, orthopnea, PND. He has allergic asthma, but has been doing well from this standpoint. Denies leg swelling, symptoms of claudication, palpitations. He has not used nitroglycerin in the past year.   Past Medical History:  Diagnosis Date   Asthma    High cholesterol    Hyperlipidemia    Hypertension    MI (myocardial infarction) [facility])    Past Surgical History:  Procedure Laterality Date   CORONARY STENT PLACEMENT      Social History   Tobacco Use   Smoking status: Never Smoker   Smokeless tobacco: Former Systems developer    Types: Chew   Tobacco comment: quit 2005  Substance Use Topics   Alcohol use: Yes    Alcohol/week: 0.0 standard drinks    Comment: occas. beer   Marital Status: Married  ROS  Review of Systems  Cardiovascular: Negative for chest pain, claudication, dyspnea on exertion, near-syncope, palpitations and syncope.  Respiratory: Positive for wheezing (occasional).    Objective   Vitals with BMI 06/29/2020 07/31/2019 10/14/2014  Height _0  _1  _2   Weight 147 lbs 146 lbs 13 oz 153 lbs  BMI 24.46 03.54 65.6  Systolic 812 751 700  Diastolic 68 75 86  Pulse 76 100 74    Physical Exam Constitutional:      Comments: Moderately built and well nourished. Trunkal obesity present  Eyes:      Conjunctiva/sclera: Conjunctivae normal.  Cardiovascular:     Rate and Rhythm: Normal rate and regular rhythm.     Pulses: Intact distal pulses.     Heart sounds: Normal heart sounds. No murmur heard.  No gallop.      Comments: No leg edema, no JVD. Pulmonary:     Effort: Pulmonary effort is normal.     Breath sounds: Normal breath sounds.  Abdominal:     General: Bowel sounds are normal.     Palpations: Abdomen is soft.  Musculoskeletal:        General: Normal range of motion.    Laboratory examination:   No results for input(s): NA, K, CL, CO2, GLUCOSE, BUN, CREATININE, CALCIUM, GFRNONAA, GFRAA in the last 8760 hours. CrCl cannot be calculated (Patient's most recent lab result is older than the maximum 21 days allowed.).  CMP Latest Ref Rng & Units 10/25/2012  Glucose 70 - 99 mg/dL 108(H)  BUN 6 - 23 mg/dL 19  Creatinine 0.50 - 1.35 mg/dL 0.93  Sodium 135 - 145 mEq/L 140  Potassium 3.5 - 5.1 mEq/L 4.0  Chloride 96 - 112 mEq/L 105  CO2 19 - 32 mEq/L 23  Calcium 8.4 - 10.5 mg/dL 9.5   CBC Latest Ref Rng & Units 10/25/2012  WBC 4.0 - 10.5 K/uL 20.0(H)  Hemoglobin 13.0 - 17.0 g/dL 14.3  Hematocrit 39 - 52 %  42.9  Platelets 150 - 400 K/uL 314   Lipid Panel  No results found for: CHOL, TRIG, HDL, CHOLHDL, VLDL, LDLCALC, LDLDIRECT   HEMOGLOBIN A1C No results found for: HGBA1C, MPG   TSH No results for input(s): TSH in the last 8760 hours.   External Labs:  Cholesterol, total 139.000 M 09/18/2018 HDL 24.000 05/02/2020 LDL-C 66.000 05/02/2020 Triglycerides 200.000 05/02/2020  A1C 6.400 04/28/2019  Creatinine, Serum 1.100 MG/ 09/18/2018 Potassium 4.200 MM 08/14/2016 ALT (SGPT) 38.000 IU/ 09/18/2018  TSH 2.440 10/13/2019   Medications and allergies  No Known Allergies   Current Outpatient Medications on File Prior to Visit  Medication Sig Dispense Refill   aspirin EC 81 MG tablet Take 162 mg by mouth at bedtime.     atorvastatin (LIPITOR) 20 MG tablet TAKE 1 TABLET(20 MG)  BY MOUTH DAILY 90 tablet 1   azelastine (ASTELIN) 0.1 % nasal spray Place into both nostrils daily. Use in each nostril as directed     brimonidine (ALPHAGAN) 0.15 % ophthalmic solution 1 drop 2 (two) times daily.     budesonide-formoterol (SYMBICORT) 160-4.5 MCG/ACT inhaler Inhale 2 puffs into the lungs 2 (two) times daily.     latanoprost (XALATAN) 0.005 % ophthalmic solution Place 1 drop into both eyes nightly.     levocetirizine (XYZAL) 5 MG tablet Take 5 mg by mouth at bedtime.     losartan (COZAAR) 100 MG tablet Take 1 tablet (100 mg total) by mouth daily. 100 mg daily 90 tablet 4   montelukast (SINGULAIR) 10 MG tablet Take 10 mg by mouth at bedtime.     nitroGLYCERIN (NITROSTAT) 0.4 MG SL tablet Place 0.4 mg under the tongue every 5 (five) minutes as needed for chest pain.     No current facility-administered medications on file prior to visit.   Radiology:  No results found.   Cardiac Studies:   Coronary angiogram 07/16/2004: Acute inferior MI. S/P mid RCA stent with 3.5 x 18 mm Cypher. 30% stenosis proximal RCA. Mild luminal irregularity involving LAD.[facility].  Treadmill Stress 03/05/2014: Indications: Assessment of Chest Pain. Conclusions: Negative for ischemia. The patient exercised according to the Bruce protocol, Total time recorded 9 Min. 31 sec. achieving a max heart rate of 154 which was 94% of MPHR for age and 10.5 METS of work. During exercise there was no ST-T changes of ischemia. Symptoms: THR met. Mild dyspnea. Arrhythmia: None. Excellent effort. Continue secondary prevention.  ABI 08/29/2017: This exam reveals mildly decreased perfusion of the lower extremity, RABI 0.92 and LABI 0.84 noted at the post tibial artery level (ABI).  EKG:   EKG 06/29/2020: Normal sinus rhythm at rate of 80 bpm, normal axis, incomplete right bundle branch block.  No evidence of ischemia, normal EKG.    EKG 07/31/2019: Normal sinus rhythm at the rate of 92 bpm,  borderline left atrial abnormality, incomplete right bundle branch block.  No evidence of ischemia.  Otherwise normal EKG. No significant change from  09/12/2018.   Assessment     ICD-10-CM   1. Coronary artery disease involving native coronary artery of native heart without angina pectoris  I25.10 EKG 12-Lead    icosapent Ethyl (VASCEPA) 1 g capsule  2. Essential hypertension  I10   3. Hypercholesteremia  E78.00     Medications Discontinued During This Encounter  Medication Reason   amoxicillin (AMOXIL) 500 MG tablet Completed Course   azelastine (OPTIVAR) 0.05 % ophthalmic solution No longer needed (for PRN medications)   predniSONE (DELTASONE) 10  MG tablet Completed Course   OVER THE COUNTER MEDICATION Patient Preference   levalbuterol (XOPENEX HFA) 45 MCG/ACT inhaler Change in therapy   Meds ordered this encounter  Medications   icosapent Ethyl (VASCEPA) 1 g capsule    Sig: Take 1 capsule (1 g total) by mouth 2 (two) times daily.    Dispense:  180 capsule    Refill:  3   Recommendations:    Yann Biehn  is a 64 y.o.  Asian [place] male with coronary artery disease and myocardial infarction in 2005 and has a stent in his right coronary artery, prediabetes, hypertension, hyperlipidemia and allergic asthma.   The patient presents for annual visit. He has been doing well overall. He has been asymptomatic without chest pain or dyspnea. He is exercising regularly without difficulty by walking 1.5 miles, he is encouraged to increase the distance and frequency of his walks. His physical examination and EKG are unchanged.   His blood pressure is well controlled. He is tolerating all medications without side effects. His weight has been stable since last year.  I reviewed his recent labs. Normal renal function. A1C is controlled. LDL is well controlled, however triglycerides are elevated. I think he would benefit from the addition of Vascepa 2 grams daily.    I will see him back  in one year for follow up visit.   Blair Heys, [place] Student 07/02/20  10:12 AM    Adrian Prows, MD, [facility] 07/02/2020, 10:12 AM Office: [phone]

## 2020-07-01 DIAGNOSIS — J300 Vasomotor rhinitis: Secondary | ICD-10-CM | POA: Diagnosis not present

## 2020-07-01 DIAGNOSIS — H1045 Other chronic allergic conjunctivitis: Secondary | ICD-10-CM | POA: Diagnosis not present

## 2020-07-01 DIAGNOSIS — J4530 Mild persistent asthma, uncomplicated: Secondary | ICD-10-CM | POA: Diagnosis not present

## 2020-07-02 ENCOUNTER — Encounter: Payer: Self-pay | Admitting: Cardiology

## 2020-09-02 DIAGNOSIS — H401133 Primary open-angle glaucoma, bilateral, severe stage: Secondary | ICD-10-CM | POA: Diagnosis not present

## 2020-09-11 DIAGNOSIS — Z20822 Contact with and (suspected) exposure to covid-19: Secondary | ICD-10-CM | POA: Diagnosis not present

## 2020-11-30 ENCOUNTER — Other Ambulatory Visit: Payer: Self-pay | Admitting: Cardiology

## 2021-02-06 DIAGNOSIS — Z79899 Other long term (current) drug therapy: Secondary | ICD-10-CM | POA: Diagnosis not present

## 2021-02-06 DIAGNOSIS — H401133 Primary open-angle glaucoma, bilateral, severe stage: Secondary | ICD-10-CM | POA: Diagnosis not present

## 2021-02-06 DIAGNOSIS — H401134 Primary open-angle glaucoma, bilateral, indeterminate stage: Secondary | ICD-10-CM | POA: Diagnosis not present

## 2021-03-21 DIAGNOSIS — Z0000 Encounter for general adult medical examination without abnormal findings: Secondary | ICD-10-CM | POA: Diagnosis not present

## 2021-03-21 DIAGNOSIS — Z125 Encounter for screening for malignant neoplasm of prostate: Secondary | ICD-10-CM | POA: Diagnosis not present

## 2021-03-27 DIAGNOSIS — Z0000 Encounter for general adult medical examination without abnormal findings: Secondary | ICD-10-CM | POA: Diagnosis not present

## 2021-03-27 DIAGNOSIS — I10 Essential (primary) hypertension: Secondary | ICD-10-CM | POA: Diagnosis not present

## 2021-03-27 DIAGNOSIS — R7303 Prediabetes: Secondary | ICD-10-CM | POA: Diagnosis not present

## 2021-03-27 DIAGNOSIS — I2510 Atherosclerotic heart disease of native coronary artery without angina pectoris: Secondary | ICD-10-CM | POA: Diagnosis not present

## 2021-03-27 DIAGNOSIS — E782 Mixed hyperlipidemia: Secondary | ICD-10-CM | POA: Diagnosis not present

## 2021-04-06 DIAGNOSIS — H401134 Primary open-angle glaucoma, bilateral, indeterminate stage: Secondary | ICD-10-CM | POA: Diagnosis not present

## 2021-04-06 DIAGNOSIS — H401133 Primary open-angle glaucoma, bilateral, severe stage: Secondary | ICD-10-CM | POA: Diagnosis not present

## 2021-04-17 DIAGNOSIS — E782 Mixed hyperlipidemia: Secondary | ICD-10-CM | POA: Diagnosis not present

## 2021-04-17 DIAGNOSIS — Z1211 Encounter for screening for malignant neoplasm of colon: Secondary | ICD-10-CM | POA: Diagnosis not present

## 2021-04-17 DIAGNOSIS — J45998 Other asthma: Secondary | ICD-10-CM | POA: Diagnosis not present

## 2021-04-17 DIAGNOSIS — I10 Essential (primary) hypertension: Secondary | ICD-10-CM | POA: Diagnosis not present

## 2021-05-29 DIAGNOSIS — I10 Essential (primary) hypertension: Secondary | ICD-10-CM | POA: Diagnosis not present

## 2021-05-29 DIAGNOSIS — I2510 Atherosclerotic heart disease of native coronary artery without angina pectoris: Secondary | ICD-10-CM | POA: Diagnosis not present

## 2021-05-29 DIAGNOSIS — Z23 Encounter for immunization: Secondary | ICD-10-CM | POA: Diagnosis not present

## 2021-05-29 DIAGNOSIS — E1165 Type 2 diabetes mellitus with hyperglycemia: Secondary | ICD-10-CM | POA: Diagnosis not present

## 2021-05-29 DIAGNOSIS — E782 Mixed hyperlipidemia: Secondary | ICD-10-CM | POA: Diagnosis not present

## 2021-06-01 ENCOUNTER — Ambulatory Visit: Payer: BC Managed Care – PPO | Admitting: Cardiology

## 2021-06-01 ENCOUNTER — Other Ambulatory Visit: Payer: Self-pay

## 2021-06-01 ENCOUNTER — Encounter: Payer: Self-pay | Admitting: Cardiology

## 2021-06-01 VITALS — BP 125/77 | HR 75 | Temp 98.4°F | Resp 17 | Ht 65.0 in | Wt 152.6 lb

## 2021-06-01 DIAGNOSIS — I2510 Atherosclerotic heart disease of native coronary artery without angina pectoris: Secondary | ICD-10-CM | POA: Diagnosis not present

## 2021-06-01 DIAGNOSIS — E119 Type 2 diabetes mellitus without complications: Secondary | ICD-10-CM | POA: Diagnosis not present

## 2021-06-01 DIAGNOSIS — I10 Essential (primary) hypertension: Secondary | ICD-10-CM | POA: Diagnosis not present

## 2021-06-01 DIAGNOSIS — E782 Mixed hyperlipidemia: Secondary | ICD-10-CM

## 2021-06-01 MED ORDER — FENOFIBRATE 48 MG PO TABS: 48.0000 mg | ORAL_TABLET | Freq: Every day | ORAL | 3 refills | Status: DC

## 2021-06-01 NOTE — Progress Notes (Signed)
Primary Physician/Referring:  Merrilee Seashore, MD  Patient ID: George Knight, male    DOB: [birth year], 65 y.o.   MRN: [id number]  Chief Complaint  Patient presents with   Coronary Artery Disease   Follow-up    1 year   HPI:    George Knight  is a 65 y.o. Asian [place] male with coronary artery disease and myocardial infarction in 2005 and has a stent in his right coronary artery, prediabetes, hypertension and hyperlipidemia presents here for 1 year follow-up.  The patient has been doing well. Denies any chest discomfort, dyspnea, orthopnea, PND. He has allergic asthma, but has been doing well from this standpoint. Denies leg swelling, symptoms of claudication, palpitations. He has not used nitroglycerin in the past year.  States that since he has been wearing mask for COVID-19, his symptoms of asthma has remained stable and he has not had any asthmatic episodes.  Past Medical History:  Diagnosis Date   Asthma    High cholesterol    Hyperlipidemia    Hypertension    MI (myocardial infarction) [facility])    Past Surgical History:  Procedure Laterality Date   CORONARY STENT PLACEMENT      Social History   Tobacco Use   Smoking status: Never   Smokeless tobacco: Former    Types: Chew   Tobacco comments:    quit 2005  Substance Use Topics   Alcohol use: Yes    Alcohol/week: 0.0 standard drinks    Comment: occas. beer   Marital Status: Married  ROS  Review of Systems  Cardiovascular:  Negative for chest pain, claudication, dyspnea on exertion, near-syncope, palpitations and syncope.  Respiratory:  Positive for wheezing (occasional).   Objective   Vitals with BMI 06/01/2021 06/29/2020 07/31/2019  Height _0  _1  _2   Weight 152 lbs 10 oz 147 lbs 146 lbs 13 oz  BMI 25.39 73.41 93.79  Systolic 024 097 353  Diastolic 77 68 75  Pulse 75 76 100    Physical Exam Constitutional:      Comments: Moderately built and well nourished. Trunkal obesity present  Eyes:      Conjunctiva/sclera: Conjunctivae normal.  Cardiovascular:     Rate and Rhythm: Normal rate and regular rhythm.     Pulses: Intact distal pulses.     Heart sounds: Normal heart sounds. No murmur heard.   No gallop.     Comments: No leg edema, no JVD. Pulmonary:     Effort: Pulmonary effort is normal.     Breath sounds: Normal breath sounds.  Abdominal:     General: Bowel sounds are normal.     Palpations: Abdomen is soft.  Musculoskeletal:        General: Normal range of motion.   Laboratory examination:   No results for input(s): NA, K, CL, CO2, GLUCOSE, BUN, CREATININE, CALCIUM, GFRNONAA, GFRAA in the last 8760 hours. CrCl cannot be calculated (Patient's most recent lab result is older than the maximum 21 days allowed.).  CMP Latest Ref Rng & Units 10/25/2012  Glucose 70 - 99 mg/dL 108(H)  BUN 6 - 23 mg/dL 19  Creatinine 0.50 - 1.35 mg/dL 0.93  Sodium 135 - 145 mEq/L 140  Potassium 3.5 - 5.1 mEq/L 4.0  Chloride 96 - 112 mEq/L 105  CO2 19 - 32 mEq/L 23  Calcium 8.4 - 10.5 mg/dL 9.5   CBC Latest Ref Rng & Units 10/25/2012  WBC 4.0 - 10.5 K/uL 20.0(H)  Hemoglobin 13.0 - 17.0 g/dL  14.3  Hematocrit 39.0 - 52.0 % 42.9  Platelets 150 - 400 K/uL 314   Lipid Panel  No results found for: CHOL, TRIG, HDL, CHOLHDL, VLDL, LDLCALC, LDLDIRECT   HEMOGLOBIN A1C No results found for: HGBA1C, MPG   TSH No results for input(s): TSH in the last 8760 hours.   External Labs:   Labs on 02/03/2021: Serum glucose 97 mg, BUN 13, creatinine 1.05, EGFR >60 mL, potassium 4.7.  CMP otherwise normal.  A1c 7.2%.  TSH normal.  PSA normal.  Total cholesterol 148, triglycerides 211, HDL 29, LDL 77.  Non-HDL cholesterol 119.  Cholesterol, total 139.000 M 09/18/2018 HDL 24.000 05/02/2020 LDL-C 66.000 05/02/2020 Triglycerides 200.000 05/02/2020  A1C 6.400 04/28/2019  Creatinine, Serum 1.100 MG/ 09/18/2018 Potassium 4.200 MM 08/14/2016 ALT (SGPT) 38.000 IU/ 09/18/2018  TSH 2.440 10/13/2019   Medications  and allergies  No Known Allergies   Current Outpatient Medications on File Prior to Visit  Medication Sig Dispense Refill   aspirin EC 81 MG tablet Take 162 mg by mouth at bedtime.     atorvastatin (LIPITOR) 20 MG tablet TAKE 1 TABLET(20 MG) BY MOUTH DAILY 90 tablet 1   azelastine (ASTELIN) 0.1 % nasal spray Place into both nostrils daily. Use in each nostril as directed     brimonidine (ALPHAGAN) 0.15 % ophthalmic solution 1 drop 2 (two) times daily.     budesonide-formoterol (SYMBICORT) 160-4.5 MCG/ACT inhaler Inhale 2 puffs into the lungs 2 (two) times daily.     latanoprost (XALATAN) 0.005 % ophthalmic solution Place 1 drop into both eyes nightly.     levalbuterol (XOPENEX HFA) 45 MCG/ACT inhaler Inhale 1 puff into the lungs every 4 (four) hours as needed for wheezing.     levocetirizine (XYZAL) 5 MG tablet Take 5 mg by mouth as needed.     losartan (COZAAR) 100 MG tablet TAKE 1 TABLET BY MOUTH DAILY 90 tablet 4   metFORMIN (GLUCOPHAGE-XR) 500 MG 24 hr tablet Take 500 mg by mouth daily.     montelukast (SINGULAIR) 10 MG tablet Take 10 mg by mouth at bedtime.     nitroGLYCERIN (NITROSTAT) 0.4 MG SL tablet Place 0.4 mg under the tongue every 5 (five) minutes as needed for chest pain.     No current facility-administered medications on file prior to visit.   Radiology:  No results found.   Cardiac Studies:   Coronary angiogram 07/16/2004: Acute inferior MI. S/P mid RCA stent with 3.5 x 18 mm Cypher. 30% stenosis proximal RCA. Mild luminal irregularity involving LAD.[facility].  Treadmill Stress 03/05/2014: Indications: Assessment of Chest Pain. Conclusions: Negative for ischemia. The patient exercised according to the Bruce protocol, Total time recorded 9 Min. 31 sec. achieving a max heart rate of 154 which was 94% of MPHR for age and 10.5 METS of work. During exercise there was no ST-T changes of ischemia. Symptoms: THR met. Mild dyspnea. Arrhythmia: None. Excellent effort.  Continue secondary prevention.  ABI 08/29/2017: This exam reveals mildly decreased perfusion of the lower extremity, RABI 0.92 and LABI 0.84 noted at the post tibial artery level (ABI).  EKG:   EKG 06/01/2021: Normal sinus rhythm at rate of 75 bpm, normal axis, incomplete right bundle branch block.  Normal EKG.  No change from 06/29/2020.  Assessment     ICD-10-CM   1. Essential hypertension  I10 EKG 12-Lead    2. Coronary artery disease involving native coronary artery of native heart without angina pectoris  I25.10  3. Mixed hyperlipidemia  E78.2 fenofibrate (TRICOR) 48 MG tablet    4. Type 2 diabetes mellitus without complication, without long-term current use of insulin (HCC)  E11.9       Medications Discontinued During This Encounter  Medication Reason   icosapent Ethyl (VASCEPA) 1 g capsule Error   Meds ordered this encounter  Medications   fenofibrate (TRICOR) 48 MG tablet    Sig: Take 1 tablet (48 mg total) by mouth daily.    Dispense:  90 tablet    Refill:  3   Recommendations:    George Knight  is a 64 y.o.  Asian [place] male with coronary artery disease and myocardial infarction in 2005 and has a stent in his right coronary artery, prediabetes, hypertension, hyperlipidemia and allergic asthma.   The patient presents for annual visit. He has been doing well overall. He has been asymptomatic without chest pain or dyspnea. His physical examination and EKG are unchanged.   His blood pressure is well controlled. He is tolerating all medications without side effects. I reviewed his recent labs. Normal renal function. A1C IS NOW IN DIABETIC RANGE.  LDL is well controlled, however triglycerides are elevated.  As this has been chronic, I have started him on Tricor 48 mg daily.  Otherwise stable from cardiac standpoint, I will see him back on an annual basis.  He will try to reduce his weight and watch his calories and also carbohydrate intake.  Jardiance could also be a  appropriate medication.   George Prows, MD, [facility] 06/01/2021, 10:48 AM Office: [phone]

## 2021-06-15 DIAGNOSIS — H401133 Primary open-angle glaucoma, bilateral, severe stage: Secondary | ICD-10-CM | POA: Diagnosis not present

## 2021-06-15 DIAGNOSIS — Z79899 Other long term (current) drug therapy: Secondary | ICD-10-CM | POA: Diagnosis not present

## 2021-06-29 ENCOUNTER — Ambulatory Visit: Payer: BC Managed Care – PPO | Admitting: Cardiology

## 2021-10-09 DIAGNOSIS — E1165 Type 2 diabetes mellitus with hyperglycemia: Secondary | ICD-10-CM | POA: Diagnosis not present

## 2021-10-09 DIAGNOSIS — I2510 Atherosclerotic heart disease of native coronary artery without angina pectoris: Secondary | ICD-10-CM | POA: Diagnosis not present

## 2021-10-09 DIAGNOSIS — I10 Essential (primary) hypertension: Secondary | ICD-10-CM | POA: Diagnosis not present

## 2021-10-09 DIAGNOSIS — E782 Mixed hyperlipidemia: Secondary | ICD-10-CM | POA: Diagnosis not present

## 2021-10-16 DIAGNOSIS — E1165 Type 2 diabetes mellitus with hyperglycemia: Secondary | ICD-10-CM | POA: Diagnosis not present

## 2021-10-16 DIAGNOSIS — E782 Mixed hyperlipidemia: Secondary | ICD-10-CM | POA: Diagnosis not present

## 2021-10-16 DIAGNOSIS — I10 Essential (primary) hypertension: Secondary | ICD-10-CM | POA: Diagnosis not present

## 2021-10-26 DIAGNOSIS — H401133 Primary open-angle glaucoma, bilateral, severe stage: Secondary | ICD-10-CM | POA: Diagnosis not present

## 2021-12-29 DIAGNOSIS — J4530 Mild persistent asthma, uncomplicated: Secondary | ICD-10-CM | POA: Diagnosis not present

## 2021-12-29 DIAGNOSIS — H1045 Other chronic allergic conjunctivitis: Secondary | ICD-10-CM | POA: Diagnosis not present

## 2021-12-29 DIAGNOSIS — J300 Vasomotor rhinitis: Secondary | ICD-10-CM | POA: Diagnosis not present

## 2022-01-25 ENCOUNTER — Other Ambulatory Visit: Payer: Self-pay | Admitting: Cardiology

## 2022-02-05 DIAGNOSIS — J300 Vasomotor rhinitis: Secondary | ICD-10-CM | POA: Diagnosis not present

## 2022-02-05 DIAGNOSIS — J4530 Mild persistent asthma, uncomplicated: Secondary | ICD-10-CM | POA: Diagnosis not present

## 2022-02-05 DIAGNOSIS — H1045 Other chronic allergic conjunctivitis: Secondary | ICD-10-CM | POA: Diagnosis not present

## 2022-02-08 DIAGNOSIS — Z1231 Encounter for screening mammogram for malignant neoplasm of breast: Secondary | ICD-10-CM | POA: Diagnosis not present

## 2022-02-23 DIAGNOSIS — H401133 Primary open-angle glaucoma, bilateral, severe stage: Secondary | ICD-10-CM | POA: Diagnosis not present

## 2022-02-23 DIAGNOSIS — Z79899 Other long term (current) drug therapy: Secondary | ICD-10-CM | POA: Diagnosis not present

## 2022-03-22 DIAGNOSIS — Z125 Encounter for screening for malignant neoplasm of prostate: Secondary | ICD-10-CM | POA: Diagnosis not present

## 2022-03-22 DIAGNOSIS — Z0000 Encounter for general adult medical examination without abnormal findings: Secondary | ICD-10-CM | POA: Diagnosis not present

## 2022-04-04 DIAGNOSIS — E782 Mixed hyperlipidemia: Secondary | ICD-10-CM | POA: Diagnosis not present

## 2022-04-04 DIAGNOSIS — I10 Essential (primary) hypertension: Secondary | ICD-10-CM | POA: Diagnosis not present

## 2022-04-04 DIAGNOSIS — R7303 Prediabetes: Secondary | ICD-10-CM | POA: Diagnosis not present

## 2022-04-04 DIAGNOSIS — Z0000 Encounter for general adult medical examination without abnormal findings: Secondary | ICD-10-CM | POA: Diagnosis not present

## 2022-04-04 DIAGNOSIS — Z23 Encounter for immunization: Secondary | ICD-10-CM | POA: Diagnosis not present

## 2022-04-18 ENCOUNTER — Other Ambulatory Visit: Payer: Self-pay | Admitting: Cardiology

## 2022-04-18 DIAGNOSIS — E782 Mixed hyperlipidemia: Secondary | ICD-10-CM

## 2022-06-01 ENCOUNTER — Ambulatory Visit: Payer: BLUE CROSS/BLUE SHIELD | Admitting: Cardiology

## 2022-06-12 ENCOUNTER — Ambulatory Visit: Payer: BLUE CROSS/BLUE SHIELD | Admitting: Cardiology

## 2022-06-12 DIAGNOSIS — E119 Type 2 diabetes mellitus without complications: Secondary | ICD-10-CM | POA: Insufficient documentation

## 2022-06-12 DIAGNOSIS — I10 Essential (primary) hypertension: Secondary | ICD-10-CM | POA: Insufficient documentation

## 2022-06-12 NOTE — Progress Notes (Deleted)
Primary Physician/Referring:  Merrilee Seashore, MD  Patient ID: George Knight, male    DOB: September 21, 1955, 66 y.o.   MRN: [id number]  No chief complaint on file.  HPI:    George Knight  is a 66 y.o. Asian [place] male with coronary artery disease and myocardial infarction in 2005 and has a stent in his right coronary artery, prediabetes, hypertension and hyperlipidemia presents here for 1 year follow-up.  The patient has been doing well. Denies any chest discomfort, dyspnea, orthopnea, PND. He has allergic asthma, but has been doing well from this standpoint. Denies leg swelling, symptoms of claudication, palpitations. He has not used nitroglycerin in the past year.  States that since he has been wearing mask for COVID-19, his symptoms of asthma has remained stable and he has not had any asthmatic episodes.  Past Medical History:  Diagnosis Date   Asthma    High cholesterol    Hyperlipidemia    Hypertension    MI (myocardial infarction) [facility])    Past Surgical History:  Procedure Laterality Date   CORONARY STENT PLACEMENT      Social History   Tobacco Use   Smoking status: Never   Smokeless tobacco: Former    Types: Chew   Tobacco comments:    quit 2005  Substance Use Topics   Alcohol use: Yes    Alcohol/week: 0.0 standard drinks of alcohol    Comment: occas. beer   Marital Status: Married  ROS  Review of Systems  Cardiovascular:  Negative for chest pain, claudication, dyspnea on exertion, near-syncope, palpitations and syncope.  Respiratory:  Positive for wheezing (occasional).    Objective      06/01/2021   10:08 AM 06/29/2020   11:03 AM 07/31/2019    3:19 PM  Vitals with BMI  Height _0  _1  _2   Weight 152 lbs 10 oz 147 lbs 146 lbs 13 oz  BMI 25.39 40.98 11.91  Systolic 478 295 621  Diastolic 77 68 75  Pulse 75 76 100    Physical Exam Constitutional:      Comments: Moderately built and well nourished. Trunkal obesity present  Eyes:      Conjunctiva/sclera: Conjunctivae normal.  Cardiovascular:     Rate and Rhythm: Normal rate and regular rhythm.     Pulses: Intact distal pulses.     Knight sounds: Normal Knight sounds. No murmur heard.    No gallop.     Comments: No leg edema, no JVD. Pulmonary:     Effort: Pulmonary effort is normal.     Breath sounds: Normal breath sounds.  Abdominal:     General: Bowel sounds are normal.     Palpations: Abdomen is soft.  Musculoskeletal:        General: Normal range of motion.    Laboratory examination:   No results for input(s): "NA", "K", "CL", "CO2", "GLUCOSE", "BUN", "CREATININE", "CALCIUM", "GFRNONAA", "GFRAA" in the last 8760 hours. CrCl cannot be calculated (Patient's most recent lab result is older than the maximum 21 days allowed.).     Latest Ref Rng & Units 10/25/2012    7:16 AM  CMP  Glucose 70 - 99 mg/dL 108   BUN 6 - 23 mg/dL 19   Creatinine 0.50 - 1.35 mg/dL 0.93   Sodium 135 - 145 mEq/L 140   Potassium 3.5 - 5.1 mEq/L 4.0   Chloride 96 - 112 mEq/L 105   CO2 19 - 32 mEq/L 23   Calcium 8.4 - 10.5 mg/dL  9.5       Latest Ref Rng & Units 10/25/2012    7:16 AM  CBC  WBC 4.0 - 10.5 K/uL 20.0   Hemoglobin 13.0 - 17.0 g/dL 14.3   Hematocrit 39.0 - 52.0 % 42.9   Platelets 150 - 400 K/uL 314    Lipid Panel  No results found for: "CHOL", "TRIG", "HDL", "CHOLHDL", "VLDL", "LDLCALC", "LDLDIRECT"   HEMOGLOBIN A1C No results found for: "HGBA1C", "MPG"   TSH No results for input(s): "TSH" in the last 8760 hours.   External Labs:   03/26/2022: Serum glucose 96, sodium 142, potassium 4.4, BUN 11, creatinine 1.2, EGFR 58, CMP otherise normal  TSH 2.90  Cholesterol 128, triglycerides 126, HDL 25, LDL 78  Hemoglobin A1c 6.6%  Medications and allergies  No Known Allergies   Current Outpatient Medications on File Prior to Visit  Medication Sig Dispense Refill   aspirin EC 81 MG tablet Take 162 mg by mouth at bedtime.     atorvastatin (LIPITOR) 20 MG tablet  TAKE 1 TABLET(20 MG) BY MOUTH DAILY 90 tablet 1   azelastine (ASTELIN) 0.1 % nasal spray Place into both nostrils daily. Use in each nostril as directed     brimonidine (ALPHAGAN) 0.15 % ophthalmic solution 1 drop 2 (two) times daily.     budesonide-formoterol (SYMBICORT) 160-4.5 MCG/ACT inhaler Inhale 2 puffs into the lungs 2 (two) times daily.     fenofibrate (TRICOR) 48 MG tablet TAKE 1 TABLET(48 MG) BY MOUTH DAILY 90 tablet 3   latanoprost (XALATAN) 0.005 % ophthalmic solution Place 1 drop into both eyes nightly.     levalbuterol (XOPENEX HFA) 45 MCG/ACT inhaler Inhale 1 puff into the lungs every 4 (four) hours as needed for wheezing.     levocetirizine (XYZAL) 5 MG tablet Take 5 mg by mouth as needed.     losartan (COZAAR) 100 MG tablet TAKE 1 TABLET BY MOUTH DAILY 90 tablet 4   metFORMIN (GLUCOPHAGE-XR) 500 MG 24 hr tablet Take 500 mg by mouth daily.     montelukast (SINGULAIR) 10 MG tablet Take 10 mg by mouth at bedtime.     nitroGLYCERIN (NITROSTAT) 0.4 MG SL tablet Place 0.4 mg under the tongue every 5 (five) minutes as needed for chest pain.     No current facility-administered medications on file prior to visit.   Radiology:  No results found.   Cardiac Studies:   Coronary angiogram 07/16/2004: Acute inferior MI. S/P mid RCA stent with 3.5 x 18 mm Cypher. 30% stenosis proximal RCA. Mild luminal irregularity involving LAD.[facility].  Treadmill Stress 03/05/2014: Indications: Assessment of Chest Pain. Conclusions: Negative for ischemia. The patient exercised according to the Bruce protocol, Total time recorded 9 Min. 31 sec. achieving a max Knight rate of 154 which was 94% of MPHR for age and 10.5 METS of work. During exercise there was no ST-T changes of ischemia. Symptoms: THR met. Mild dyspnea. Arrhythmia: None. Excellent effort. Continue secondary prevention.  ABI 08/29/2017: This exam reveals mildly decreased perfusion of the lower extremity, RABI 0.92 and LABI 0.84  noted at the post tibial artery level (ABI).  EKG:   EKG 06/01/2021: Normal sinus rhythm at rate of 75 bpm, normal axis, incomplete right bundle branch block.  Normal EKG.  No change from 06/29/2020.  Assessment     ICD-10-CM   1. Coronary artery disease involving native coronary artery of native Knight without angina pectoris  I25.10     2. Essential hypertension  I10  3. Mixed hyperlipidemia  E78.2     4. Type 2 diabetes mellitus without complication, without long-term current use of insulin (HCC)  E11.9        There are no discontinued medications.  No orders of the defined types were placed in this encounter.  Recommendations:    George Knight  is a 66 y.o.  [place] male with coronary artery disease and myocardial infarction in 2005 and has a stent in his right coronary artery, prediabetes, hypertension, hyperlipidemia and allergic asthma.   The patient presents for annual visit. He has been doing well overall. He has been asymptomatic without chest pain or dyspnea. His physical examination and EKG are unchanged.   His blood pressure is well controlled. He is tolerating all medications without side effects. I reviewed his recent labs. Normal renal function. A1C IS NOW IN DIABETIC RANGE.  LDL is well controlled, however triglycerides are elevated.  As this has been chronic, I have started him on Tricor 48 mg daily.  Otherwise stable from cardiac standpoint, I will see him back on an annual basis.  He will try to reduce his weight and watch his calories and also carbohydrate intake.  Jardiance could also be a appropriate medication.   Ernst Spell, NP 06/12/2022, 12:46 PM Office: [phone]

## 2022-06-15 ENCOUNTER — Ambulatory Visit: Payer: BLUE CROSS/BLUE SHIELD | Admitting: Cardiology

## 2022-06-20 ENCOUNTER — Encounter: Payer: Self-pay | Admitting: Cardiology

## 2022-06-20 ENCOUNTER — Ambulatory Visit: Payer: BLUE CROSS/BLUE SHIELD | Admitting: Cardiology

## 2022-06-20 VITALS — BP 128/76 | HR 76 | Temp 98.0°F | Resp 16 | Ht 65.0 in | Wt 149.8 lb

## 2022-06-20 DIAGNOSIS — E782 Mixed hyperlipidemia: Secondary | ICD-10-CM

## 2022-06-20 DIAGNOSIS — I10 Essential (primary) hypertension: Secondary | ICD-10-CM

## 2022-06-20 DIAGNOSIS — I2510 Atherosclerotic heart disease of native coronary artery without angina pectoris: Secondary | ICD-10-CM | POA: Diagnosis not present

## 2022-06-20 DIAGNOSIS — E1169 Type 2 diabetes mellitus with other specified complication: Secondary | ICD-10-CM

## 2022-06-20 MED ORDER — SILDENAFIL CITRATE 100 MG PO TABS: 100.0000 mg | ORAL_TABLET | Freq: Every day | ORAL | 2 refills | Status: DC | PRN

## 2022-06-20 MED ORDER — EZETIMIBE 10 MG PO TABS: 10.0000 mg | ORAL_TABLET | Freq: Every day | ORAL | 3 refills | Status: DC

## 2022-06-20 MED ORDER — ATORVASTATIN CALCIUM 20 MG PO TABS: 20.0000 mg | ORAL_TABLET | Freq: Every day | ORAL | 3 refills | Status: DC

## 2022-06-20 MED ORDER — NITROGLYCERIN 0.4 MG/SPRAY TL SOLN: 1.0000 | 12 refills | Status: DC | PRN

## 2022-06-20 NOTE — Progress Notes (Addendum)
Primary Physician/Referring:  Merrilee Seashore, MD  Patient ID: George Knight, male    DOB: 09/27/1955, 66 y.o.   MRN: [id number]  Chief Complaint  Patient presents with   Coronary Artery Disease   Hypertension   Hyperlipidemia   Follow-up    1 year   HPI:    George Knight  is a 66 y.o. Asian [place] male with coronary artery disease and myocardial infarction in 2005 and has a stent in his right coronary artery, prediabetes, hypertension, hyperlipidemia and allergic asthma.   The patient presents for annual visit. He has been doing well overall. He has been asymptomatic without chest pain or dyspnea except when he has allergies then he has wheezing and uses as needed inhaler.  His symptoms of asthma has remained stable and he has not had any asthmatic episodes.  Past Medical History:  Diagnosis Date   Asthma    High cholesterol    Hyperlipidemia    Hypertension    MI (myocardial infarction) [facility] [address]    Past Surgical History:  Procedure Laterality Date   CORONARY STENT PLACEMENT      Social History   Tobacco Use   Smoking status: Never   Smokeless tobacco: Former    Types: Chew    Quit date: 2005   Tobacco comments:    quit 2005  Substance Use Topics   Alcohol use: Yes    Alcohol/week: 0.0 standard drinks of alcohol    Comment: occas. beer   Marital Status: Married  ROS  Review of Systems  Cardiovascular:  Negative for chest pain, dyspnea on exertion and leg swelling.   Objective      06/20/2022   10:43 AM 06/01/2021   10:08 AM 06/29/2020   11:03 AM  Vitals with BMI  Height '5\' 5"'  '5\' 5"'  '5\' 5"'   Weight 149 lbs 13 oz 152 lbs 10 oz 147 lbs  BMI 24.93 47.42 59.56  Systolic 387 564 332  Diastolic 76 77 68  Pulse 76 75 76    Physical Exam Neck:     Vascular: No carotid bruit or JVD.  Cardiovascular:     Rate and Rhythm: Normal rate and regular rhythm.     Pulses: Intact distal pulses.     Heart sounds: Normal heart sounds. No murmur heard.    No gallop.   Pulmonary:     Effort: Pulmonary effort is normal.     Breath sounds: Normal breath sounds.  Abdominal:     General: Bowel sounds are normal.     Palpations: Abdomen is soft.  Musculoskeletal:     Right lower leg: No edema.     Left lower leg: No edema.    Laboratory examination:   External Labs:   Labs 03/26/2022:  Hb 13.4/HCT 40.9, platelets 343, normal indicis.  Sodium 142, potassium 4.4, chloride 106, serum glucose 96 mg, BUN 81, creatinine 1.29, EGFR 58/67.  mL.  TSH normal.  A1c 6.6%.  PSA normal.  Total cholesterol 128, triglycerides 126, HDL 25, LDL 78.  Labs 02/03/2021: Serum glucose 97 mg, BUN 13, creatinine 1.05, EGFR >60 mL, potassium 4.7.  CMP otherwise normal.  A1c 7.2%.  TSH normal.  PSA normal.  Total cholesterol 148, triglycerides 211, HDL 29, LDL 77.  Non-HDL cholesterol 119.  Medications and allergies  No Known Allergies   Current Outpatient Medications:    aspirin EC 81 MG tablet, Take 162 mg by mouth at bedtime., Disp: , Rfl:    azelastine (ASTELIN) 0.1 % nasal  spray, Place into both nostrils daily. Use in each nostril as directed, Disp: , Rfl:    Brinzolamide-Brimonidine (SIMBRINZA) 1-0.2 % SUSP, Place 1-2 drops into both eyes daily., Disp: , Rfl:    budesonide-formoterol (SYMBICORT) 160-4.5 MCG/ACT inhaler, Inhale 2 puffs into the lungs 2 (two) times daily., Disp: , Rfl:    ezetimibe (ZETIA) 10 MG tablet, Take 1 tablet (10 mg total) by mouth daily after supper., Disp: 90 tablet, Rfl: 3   fenofibrate (TRICOR) 48 MG tablet, TAKE 1 TABLET(48 MG) BY MOUTH DAILY, Disp: 90 tablet, Rfl: 3   latanoprost (XALATAN) 0.005 % ophthalmic solution, Place 1 drop into both eyes nightly., Disp: , Rfl:    levalbuterol (XOPENEX HFA) 45 MCG/ACT inhaler, Inhale 1 puff into the lungs every 4 (four) hours as needed for wheezing., Disp: , Rfl:    levocetirizine (XYZAL) 5 MG tablet, Take 5 mg by mouth as needed., Disp: , Rfl:    losartan (COZAAR) 100 MG tablet, TAKE 1 TABLET  BY MOUTH DAILY, Disp: 90 tablet, Rfl: 4   metFORMIN (GLUCOPHAGE-XR) 500 MG 24 hr tablet, Take 500 mg by mouth daily., Disp: , Rfl:    montelukast (SINGULAIR) 10 MG tablet, Take 10 mg by mouth at bedtime., Disp: , Rfl:    nitroGLYCERIN (NITROLINGUAL) 0.4 MG/SPRAY spray, Place 1 spray under the tongue every 5 (five) minutes x 3 doses as needed for chest pain., Disp: 12 g, Rfl: 12   sildenafil (VIAGRA) 100 MG tablet, Take 1 tablet (100 mg total) by mouth daily as needed for erectile dysfunction., Disp: 30 tablet, Rfl: 2   atorvastatin (LIPITOR) 20 MG tablet, Take 1 tablet (20 mg total) by mouth daily., Disp: 90 tablet, Rfl: 3   Radiology:  No results found.   Cardiac Studies:   Coronary angiogram 07/16/2004: Acute inferior MI. S/P mid RCA stent with 3.5 x 18 mm Cypher. 30% stenosis proximal RCA. Mild luminal irregularity involving LAD.[facility].  Treadmill Stress 03/05/2014: Indications: Assessment of Chest Pain. Conclusions: Negative for ischemia. The patient exercised according to the Bruce protocol, Total time recorded 9 Min. 31 sec. achieving a max heart rate of 154 which was 94% of MPHR for age and 10.5 METS of work. During exercise there was no ST-T changes of ischemia. Symptoms: THR met. Mild dyspnea. Arrhythmia: None. Excellent effort. Continue secondary prevention.  ABI 08/29/2017: This exam reveals mildly decreased perfusion of the lower extremity, RABI 0.92 and LABI 0.84 noted at the post tibial artery level (ABI).  EKG:   EKG 06/20/2022: Normal sinus rhythm at rate of 77 bpm, incomplete right bundle branch block.  Normal EKG. no change from 06/01/2021.  Assessment     ICD-10-CM   1. Coronary artery disease involving native coronary artery of native heart without angina pectoris  I25.10 EKG 12-Lead    PCV ECHOCARDIOGRAM COMPLETE    PCV MYOCARDIAL PERFUSION WO LEXISCAN    nitroGLYCERIN (NITROLINGUAL) 0.4 MG/SPRAY spray    2. Essential hypertension  I10     3. Mixed  hyperlipidemia  E78.2 ezetimibe (ZETIA) 10 MG tablet    atorvastatin (LIPITOR) 20 MG tablet    4. Erectile dysfunction due to diabetes mellitus (HCC)  E11.69 sildenafil (VIAGRA) 100 MG tablet   N52.1       Medications Discontinued During This Encounter  Medication Reason   brimonidine (ALPHAGAN) 0.15 % ophthalmic solution    nitroGLYCERIN (NITROSTAT) 0.4 MG SL tablet    atorvastatin (LIPITOR) 20 MG tablet Reorder   Meds ordered this encounter  Medications   ezetimibe (ZETIA) 10 MG tablet    Sig: Take 1 tablet (10 mg total) by mouth daily after supper.    Dispense:  90 tablet    Refill:  3   nitroGLYCERIN (NITROLINGUAL) 0.4 MG/SPRAY spray    Sig: Place 1 spray under the tongue every 5 (five) minutes x 3 doses as needed for chest pain.    Dispense:  12 g    Refill:  12   atorvastatin (LIPITOR) 20 MG tablet    Sig: Take 1 tablet (20 mg total) by mouth daily.    Dispense:  90 tablet    Refill:  3   sildenafil (VIAGRA) 100 MG tablet    Sig: Take 1 tablet (100 mg total) by mouth daily as needed for erectile dysfunction.    Dispense:  30 tablet    Refill:  2   Recommendations:    George Knight  is a 66 y.o.  Asian [place] male with coronary artery disease and myocardial infarction in 2005 and has a stent in his right coronary artery, prediabetes, hypertension, hyperlipidemia and allergic asthma.   The patient presents for annual visit. He has been doing well overall. He has been asymptomatic without chest pain or dyspnea except when he has allergies then he has wheezing and uses as needed inhaler. His physical examination and EKG are unchanged.   His blood pressure is well controlled. He is tolerating all medications without side effects. I reviewed his recent labs.  His LDL is >70, will add Zetia to his present medical regimen.  His triglycerides remain elevated in spite of being on fenofibric acid.  He clearly needs to make changes to his diet with regard to this and reduce his  carbohydrate intake.    With regard to coronary artery disease, I have given him Nitrolingual Pumpspray instead of nitroglycerin tablets, I will schedule him for a exercise nuclear stress test and an echocardiogram as it has been >8-10 years since his last testing.  He has repeat follow-up visit with Dr. Ashby Dawes, he can recheck lipids at that time in a month or so.  Otherwise certainly we could check some time over the next 6 to 8 months.  I will see him back on an annual basis.  Patient has developed mild erectile dysfunction, I have prescribed him Viagra, he is aware of the interaction with nitroglycerin.   Adrian Prows, MD, [facility] 06/20/2022, 11:25 AM Office: [phone]

## 2022-07-04 DIAGNOSIS — H401133 Primary open-angle glaucoma, bilateral, severe stage: Secondary | ICD-10-CM | POA: Diagnosis not present

## 2022-07-09 ENCOUNTER — Other Ambulatory Visit: Payer: BLUE CROSS/BLUE SHIELD

## 2022-07-10 ENCOUNTER — Other Ambulatory Visit: Payer: BLUE CROSS/BLUE SHIELD

## 2022-07-23 ENCOUNTER — Ambulatory Visit: Payer: BLUE CROSS/BLUE SHIELD

## 2022-07-23 DIAGNOSIS — I2510 Atherosclerotic heart disease of native coronary artery without angina pectoris: Secondary | ICD-10-CM

## 2022-07-23 NOTE — Progress Notes (Signed)
Patient aware of the results. Left message. Essentially normal echo.  Echocardiogram 07/23/2022:  Normal LV systolic function with visual EF 60-65%. Left ventricle cavity is normal in size. Normal left ventricular wall thickness. Normal global wall motion. Doppler evidence of grade I (impaired) diastolic dysfunction, normal LAP. Calculated EF 70%. Structurally normal mitral valve.  Mild (Grade I) mitral regurgitation. Structurally normal tricuspid valve with trace regurgitation. No evidence of pulmonary hypertension. No prior available for comparison.

## 2022-07-24 ENCOUNTER — Ambulatory Visit: Payer: BLUE CROSS/BLUE SHIELD

## 2022-07-24 DIAGNOSIS — I2510 Atherosclerotic heart disease of native coronary artery without angina pectoris: Secondary | ICD-10-CM

## 2022-07-30 DIAGNOSIS — J300 Vasomotor rhinitis: Secondary | ICD-10-CM | POA: Diagnosis not present

## 2022-07-30 DIAGNOSIS — H1045 Other chronic allergic conjunctivitis: Secondary | ICD-10-CM | POA: Diagnosis not present

## 2022-07-30 DIAGNOSIS — J4530 Mild persistent asthma, uncomplicated: Secondary | ICD-10-CM | POA: Diagnosis not present

## 2022-08-01 NOTE — Progress Notes (Signed)
Labs 03/26/2022:  Total cholesterol 128, triglycerides 126, HDL 25, LDL 78.  Non-HDL cholesterol 103.  A1c 6.6%.  TSH normal at 2.90.  Sodium 142, potassium 4.4, BUN 11, creatinine 1.29, EGFR 58/67 mL.  Hb 13.4/HCT 40.9, platelets 343.  Discussed the stress test with the patient, continue medical therapy for now, patient will inform me if he has worsening shortness of breath or any chest discomfort.

## 2022-08-06 DIAGNOSIS — R7303 Prediabetes: Secondary | ICD-10-CM | POA: Diagnosis not present

## 2022-08-06 DIAGNOSIS — I10 Essential (primary) hypertension: Secondary | ICD-10-CM | POA: Diagnosis not present

## 2022-08-06 DIAGNOSIS — E782 Mixed hyperlipidemia: Secondary | ICD-10-CM | POA: Diagnosis not present

## 2022-08-06 DIAGNOSIS — I2510 Atherosclerotic heart disease of native coronary artery without angina pectoris: Secondary | ICD-10-CM | POA: Diagnosis not present

## 2022-12-14 DIAGNOSIS — H401133 Primary open-angle glaucoma, bilateral, severe stage: Secondary | ICD-10-CM | POA: Diagnosis not present

## 2023-01-01 DIAGNOSIS — R7303 Prediabetes: Secondary | ICD-10-CM | POA: Diagnosis not present

## 2023-01-01 DIAGNOSIS — E782 Mixed hyperlipidemia: Secondary | ICD-10-CM | POA: Diagnosis not present

## 2023-01-08 DIAGNOSIS — I10 Essential (primary) hypertension: Secondary | ICD-10-CM | POA: Diagnosis not present

## 2023-01-08 DIAGNOSIS — N182 Chronic kidney disease, stage 2 (mild): Secondary | ICD-10-CM | POA: Diagnosis not present

## 2023-01-08 DIAGNOSIS — E782 Mixed hyperlipidemia: Secondary | ICD-10-CM | POA: Diagnosis not present

## 2023-01-08 DIAGNOSIS — R7303 Prediabetes: Secondary | ICD-10-CM | POA: Diagnosis not present

## 2023-02-07 DIAGNOSIS — H6981 Other specified disorders of Eustachian tube, right ear: Secondary | ICD-10-CM | POA: Diagnosis not present

## 2023-02-07 DIAGNOSIS — R21 Rash and other nonspecific skin eruption: Secondary | ICD-10-CM | POA: Diagnosis not present

## 2023-02-07 DIAGNOSIS — L989 Disorder of the skin and subcutaneous tissue, unspecified: Secondary | ICD-10-CM | POA: Diagnosis not present

## 2023-02-21 ENCOUNTER — Encounter: Payer: Self-pay | Admitting: Podiatry

## 2023-02-21 ENCOUNTER — Ambulatory Visit: Payer: Medicare Other | Admitting: Podiatry

## 2023-02-21 DIAGNOSIS — L918 Other hypertrophic disorders of the skin: Secondary | ICD-10-CM | POA: Diagnosis not present

## 2023-02-21 DIAGNOSIS — M722 Plantar fascial fibromatosis: Secondary | ICD-10-CM | POA: Insufficient documentation

## 2023-02-21 DIAGNOSIS — R7303 Prediabetes: Secondary | ICD-10-CM | POA: Insufficient documentation

## 2023-02-21 DIAGNOSIS — Z0000 Encounter for general adult medical examination without abnormal findings: Secondary | ICD-10-CM | POA: Insufficient documentation

## 2023-02-24 NOTE — Progress Notes (Signed)
Subjective:  Patient ID: George Knight, male    DOB: 03/14/1956,  MRN: [id number] HPI Chief Complaint  Patient presents with   Skin Problem    Plantar arch/heel right - soft lesion since high school, never had any pain with it, but now the skin has peeled up and keeps catching it on his socks causing pain   New Patient (Initial Visit)    67 y.o. male presents with the above complaint.   ROS: Denies fever chills nausea vomit muscle aches pains calf pain back pain chest pain shortness of breath.  Past Medical History:  Diagnosis Date   Asthma    High cholesterol    Hyperlipidemia    Hypertension    MI (myocardial infarction) (HCC)    Past Surgical History:  Procedure Laterality Date   CORONARY STENT PLACEMENT      Current Outpatient Medications:    loratadine (CLARITIN) 10 MG tablet, Take 10 mg by mouth daily., Disp: , Rfl:    simvastatin (ZOCOR) 40 MG tablet, Take by mouth., Disp: , Rfl:    aspirin EC 81 MG tablet, Take 162 mg by mouth at bedtime., Disp: , Rfl:    atorvastatin (LIPITOR) 20 MG tablet, Take 1 tablet (20 mg total) by mouth daily., Disp: 90 tablet, Rfl: 3   azelastine (ASTELIN) 0.1 % nasal spray, Place into both nostrils daily. Use in each nostril as directed, Disp: , Rfl:    Brinzolamide-Brimonidine (SIMBRINZA) 1-0.2 % SUSP, Place 1-2 drops into both eyes daily., Disp: , Rfl:    budesonide-formoterol (SYMBICORT) 160-4.5 MCG/ACT inhaler, Inhale 2 puffs into the lungs 2 (two) times daily., Disp: , Rfl:    ezetimibe (ZETIA) 10 MG tablet, Take 1 tablet (10 mg total) by mouth daily after supper., Disp: 90 tablet, Rfl: 3   fenofibrate (TRICOR) 48 MG tablet, TAKE 1 TABLET(48 MG) BY MOUTH DAILY, Disp: 90 tablet, Rfl: 3   latanoprost (XALATAN) 0.005 % ophthalmic solution, Place 1 drop into both eyes nightly., Disp: , Rfl:    levalbuterol (XOPENEX HFA) 45 MCG/ACT inhaler, Inhale 1 puff into the lungs every 4 (four) hours as needed for wheezing., Disp: , Rfl:     levocetirizine (XYZAL) 5 MG tablet, Take 5 mg by mouth as needed., Disp: , Rfl:    losartan (COZAAR) 100 MG tablet, TAKE 1 TABLET BY MOUTH DAILY, Disp: 90 tablet, Rfl: 4   metFORMIN (GLUCOPHAGE-XR) 500 MG 24 hr tablet, Take 500 mg by mouth daily., Disp: , Rfl:    montelukast (SINGULAIR) 10 MG tablet, Take 10 mg by mouth at bedtime., Disp: , Rfl:    nitroGLYCERIN (NITROLINGUAL) 0.4 MG/SPRAY spray, Place 1 spray under the tongue every 5 (five) minutes x 3 doses as needed for chest pain., Disp: 12 g, Rfl: 12   sildenafil (VIAGRA) 100 MG tablet, Take 1 tablet (100 mg total) by mouth daily as needed for erectile dysfunction., Disp: 30 tablet, Rfl: 2  No Known Allergies Review of Systems Objective:  There were no vitals filed for this visit.  General: Well developed, nourished, in no acute distress, alert and oriented x3   Dermatological: Skin is warm, dry and supple bilateral. Nails x 10 are well maintained; remaining integument appears unremarkable at this time. There are no open sores, no preulcerative lesions, no rash or signs of infection present.  30+ year history of skin tag to the plantar aspect of the right foot.  States becoming more painful with shoe gear.  It does measure about 2-1/2 cm in total  length of the pedicle is only about a centimeter.    Vascular: Dorsalis Pedis artery and Posterior Tibial artery pedal pulses are 2/4 bilateral with immedate capillary fill time. Pedal hair growth present. No varicosities and no lower extremity edema present bilateral.   Neruologic: Grossly intact via light touch bilateral. Vibratory intact via tuning fork bilateral. Protective threshold with Semmes Wienstein monofilament intact to all pedal sites bilateral. Patellar and Achilles deep tendon reflexes 2+ bilateral. No Babinski or clonus noted bilateral.   Musculoskeletal: No gross boney pedal deformities bilateral. No pain, crepitus, or limitation noted with foot and ankle range of motion  bilateral. Muscular strength 5/5 in all groups tested bilateral.  Gait: Unassisted, Nonantalgic.    Radiographs:  None taken   Assessment & Plan:   Assessment: Skin tag plantar aspect right foot just distal to the arch  Plan: Consented him for excision of lesion skin tag Ibucodone heel right.  Discussed nonweightbearing after surgery he understands and is amenable to it discussed etiology pathology and surgical therapies discussed appropriate shoe gear discussed possible complications associated with surgery.  These may include but are not limited to postop pain bleeding swell infection recurrence need for further surgery.  Follow-up with [place] in the near future for surgical intervention.     Khayla Koppenhaver T. [place], [place]

## 2023-04-09 DIAGNOSIS — L920 Granuloma annulare: Secondary | ICD-10-CM | POA: Diagnosis not present

## 2023-04-27 ENCOUNTER — Other Ambulatory Visit: Payer: Self-pay | Admitting: Cardiology

## 2023-05-02 ENCOUNTER — Other Ambulatory Visit: Payer: Self-pay | Admitting: Cardiology

## 2023-05-02 DIAGNOSIS — E782 Mixed hyperlipidemia: Secondary | ICD-10-CM

## 2023-05-14 DIAGNOSIS — R7303 Prediabetes: Secondary | ICD-10-CM | POA: Diagnosis not present

## 2023-05-14 DIAGNOSIS — R5383 Other fatigue: Secondary | ICD-10-CM | POA: Diagnosis not present

## 2023-05-14 DIAGNOSIS — I2510 Atherosclerotic heart disease of native coronary artery without angina pectoris: Secondary | ICD-10-CM | POA: Diagnosis not present

## 2023-05-14 DIAGNOSIS — Z125 Encounter for screening for malignant neoplasm of prostate: Secondary | ICD-10-CM | POA: Diagnosis not present

## 2023-05-14 DIAGNOSIS — E782 Mixed hyperlipidemia: Secondary | ICD-10-CM | POA: Diagnosis not present

## 2023-05-14 DIAGNOSIS — I10 Essential (primary) hypertension: Secondary | ICD-10-CM | POA: Diagnosis not present

## 2023-05-16 DIAGNOSIS — H401133 Primary open-angle glaucoma, bilateral, severe stage: Secondary | ICD-10-CM | POA: Diagnosis not present

## 2023-05-19 DIAGNOSIS — E119 Type 2 diabetes mellitus without complications: Secondary | ICD-10-CM | POA: Diagnosis not present

## 2023-05-21 DIAGNOSIS — E782 Mixed hyperlipidemia: Secondary | ICD-10-CM | POA: Diagnosis not present

## 2023-05-21 DIAGNOSIS — I10 Essential (primary) hypertension: Secondary | ICD-10-CM | POA: Diagnosis not present

## 2023-05-21 DIAGNOSIS — R7303 Prediabetes: Secondary | ICD-10-CM | POA: Diagnosis not present

## 2023-05-21 DIAGNOSIS — Z23 Encounter for immunization: Secondary | ICD-10-CM | POA: Diagnosis not present

## 2023-05-21 DIAGNOSIS — Z0000 Encounter for general adult medical examination without abnormal findings: Secondary | ICD-10-CM | POA: Diagnosis not present

## 2023-05-21 DIAGNOSIS — N182 Chronic kidney disease, stage 2 (mild): Secondary | ICD-10-CM | POA: Diagnosis not present

## 2023-06-12 ENCOUNTER — Ambulatory Visit: Payer: BLUE CROSS/BLUE SHIELD | Admitting: Cardiology

## 2023-06-29 ENCOUNTER — Other Ambulatory Visit: Payer: Self-pay | Admitting: Cardiology

## 2023-06-29 DIAGNOSIS — E1169 Type 2 diabetes mellitus with other specified complication: Secondary | ICD-10-CM

## 2023-07-08 DIAGNOSIS — H401133 Primary open-angle glaucoma, bilateral, severe stage: Secondary | ICD-10-CM | POA: Diagnosis not present

## 2023-07-08 DIAGNOSIS — H2513 Age-related nuclear cataract, bilateral: Secondary | ICD-10-CM | POA: Diagnosis not present

## 2023-07-08 DIAGNOSIS — H3552 Pigmentary retinal dystrophy: Secondary | ICD-10-CM | POA: Diagnosis not present

## 2023-07-08 DIAGNOSIS — E1136 Type 2 diabetes mellitus with diabetic cataract: Secondary | ICD-10-CM | POA: Diagnosis not present

## 2023-07-08 DIAGNOSIS — E119 Type 2 diabetes mellitus without complications: Secondary | ICD-10-CM | POA: Diagnosis not present

## 2023-07-09 ENCOUNTER — Ambulatory Visit: Payer: BLUE CROSS/BLUE SHIELD | Admitting: Cardiology

## 2023-07-29 DIAGNOSIS — H1045 Other chronic allergic conjunctivitis: Secondary | ICD-10-CM | POA: Diagnosis not present

## 2023-07-29 DIAGNOSIS — J300 Vasomotor rhinitis: Secondary | ICD-10-CM | POA: Diagnosis not present

## 2023-07-29 DIAGNOSIS — J4530 Mild persistent asthma, uncomplicated: Secondary | ICD-10-CM | POA: Diagnosis not present

## 2023-08-09 ENCOUNTER — Other Ambulatory Visit: Payer: Self-pay | Admitting: Cardiology

## 2023-08-09 DIAGNOSIS — E782 Mixed hyperlipidemia: Secondary | ICD-10-CM

## 2023-09-10 ENCOUNTER — Other Ambulatory Visit: Payer: Self-pay | Admitting: Cardiology

## 2023-09-10 DIAGNOSIS — H547 Unspecified visual loss: Secondary | ICD-10-CM | POA: Diagnosis not present

## 2023-09-10 DIAGNOSIS — E782 Mixed hyperlipidemia: Secondary | ICD-10-CM

## 2023-09-18 NOTE — Progress Notes (Signed)
 Cardiology Office Note:    Date:  09/19/2023   ID:  George Knight, DOB September 21, 1955, MRN [id number]  PCP:  Verdia Lombard, MD   [facility] Providers Cardiologist:  Gordy Bergamo, MD     Referring MD: Verdia Lombard, MD   Chief Complaint  Patient presents with   Follow-up    CAD    History of Present Illness:    George Knight is a 68 y.o. male with a hx of CAD, HTN, prior tobacco abuse, and HLD. RCA stenting in 2005. Last seen by Dr. Bergamo 06/2022 and now presents for annual cardiology follow up. Last stress test and echo 07/2022.  He presents for annual cardiology follow up.  No chest pain. He remains active, but irregular exercise. He is about to leave for [place] with his son [facility].     Past Medical History:  Diagnosis Date   Asthma    High cholesterol    Hyperlipidemia    Hypertension    MI (myocardial infarction) (HCC)     Past Surgical History:  Procedure Laterality Date   CORONARY STENT PLACEMENT      Current Medications: Current Meds  Medication Sig   aspirin  EC 81 MG tablet Take 162 mg by mouth at bedtime.   azelastine (ASTELIN) 0.1 % nasal spray Place into both nostrils daily. Use in each nostril as directed   Brinzolamide-Brimonidine (SIMBRINZA) 1-0.2 % SUSP Place 1-2 drops into both eyes daily.   budesonide-formoterol (SYMBICORT) 160-4.5 MCG/ACT inhaler Inhale 2 puffs into the lungs 2 (two) times daily.   ezetimibe  (ZETIA ) 10 MG tablet Take 1 tablet (10 mg total) by mouth daily.   latanoprost (XALATAN) 0.005 % ophthalmic solution Place 1 drop into both eyes nightly.   levalbuterol  (XOPENEX  HFA) 45 MCG/ACT inhaler Inhale 1 puff into the lungs every 4 (four) hours as needed for wheezing.   levocetirizine (XYZAL) 5 MG tablet Take 5 mg by mouth as needed.   loratadine (CLARITIN) 10 MG tablet Take 10 mg by mouth daily.   metFORMIN (GLUCOPHAGE-XR) 500 MG 24 hr tablet Take 500 mg by mouth daily.   montelukast (SINGULAIR)  10 MG tablet Take 10 mg by mouth at bedtime.   sildenafil  (VIAGRA ) 100 MG tablet TAKE 1 TABLET(100 MG) BY MOUTH DAILY AS NEEDED FOR ERECTILE DYSFUNCTION   [DISCONTINUED] atorvastatin  (LIPITOR) 20 MG tablet TAKE 1 TABLET(20 MG) BY MOUTH DAILY   [DISCONTINUED] fenofibrate  (TRICOR ) 48 MG tablet TAKE 1 TABLET(48 MG) BY MOUTH DAILY   [DISCONTINUED] losartan  (COZAAR ) 100 MG tablet TAKE 1 TABLET BY MOUTH DAILY   [DISCONTINUED] nitroGLYCERIN  (NITROLINGUAL ) 0.4 MG/SPRAY spray Place 1 spray under the tongue every 5 (five) minutes x 3 doses as needed for chest pain.     Allergies:   Patient has no known allergies.   Social History   Socioeconomic History   Marital status: Married    Spouse name: Ranjan   Number of children: 2   Years of education: degree   Highest education level: Not on file  Occupational History    Comment: hospitality  Tobacco Use   Smoking status: Never   Smokeless tobacco: Former    Types: Chew    Quit date: 2005   Tobacco comments:    quit 2005  Vaping Use   Vaping status: Never Used  Substance and Sexual Activity   Alcohol use: Yes    Alcohol/week: 0.0 standard drinks of alcohol    Comment: occas. beer   Drug use:  No   Sexual activity: Not on file  Other Topics Concern   Not on file  Social History Narrative   Patient is consumes occas. caffeine   Social Drivers of Corporate Investment Banker Strain: Not on file  Food Insecurity: Not on file  Transportation Needs: Not on file  Physical Activity: Not on file  Stress: Not on file  Social Connections: Unknown (01/23/2022)   Received from [facility]   Social Network    Social Network: Not on file     Family History: The patient's family history includes Diabetes in his father; Hypertension in his father; Pancreatic cancer in his mother.  ROS:   Please see the history of present illness.     All other systems reviewed and are negative.  EKGs/Labs/Other Studies Reviewed:    The  following studies were reviewed today:  Cardiac Studies & Procedures      ECHOCARDIOGRAM  PCV ECHOCARDIOGRAM COMPLETE 07/23/2022  Narrative Echocardiogram 07/23/2022: Normal LV systolic function with visual EF 60-65%. Left ventricle cavity is normal in size. Normal left ventricular wall thickness. Normal global wall motion. Doppler evidence of grade I (impaired) diastolic dysfunction, normal LAP. Calculated EF 70%. Structurally normal mitral valve.  Mild (Grade I) mitral regurgitation. Structurally normal tricuspid valve with trace regurgitation. No evidence of pulmonary hypertension. No prior available for comparison.              EKG Interpretation Date/Time:  Thursday September 19 2023 16:37:09 EST Ventricular Rate:  72 PR Interval:  144 QRS Duration:  88 QT Interval:  382 QTC Calculation: 418 R Axis:   80  Text Interpretation: Normal sinus rhythm Normal ECG Confirmed by Madie Slough ([id number]) on 09/19/2023 4:38:12 PM    Recent Labs: No results found for requested labs within last 365 days.  Recent Lipid Panel No results found for: CHOL, TRIG, HDL, CHOLHDL, VLDL, LDLCALC, LDLDIRECT   Risk Assessment/Calculations:      HYPERTENSION CONTROL Vitals:   09/19/23 1543 09/19/23 1625  BP: (!) 152/66 (!) 140/76    The patient's blood pressure is elevated above target today.  In order to address the patient's elevated BP: Blood pressure will be monitored at home to determine if medication changes need to be made.            Physical Exam:    VS:  BP (!) 140/76   Pulse 78   Ht 5' 4 (1.626 m)   Wt 149 lb (67.6 kg)   SpO2 99%   BMI 25.58 kg/m     Wt Readings from Last 3 Encounters:  09/19/23 149 lb (67.6 kg)  06/20/22 149 lb 12.8 oz (67.9 kg)  06/01/21 152 lb 9.6 oz (69.2 kg)     GEN:  Well nourished, well developed in no acute distress HEENT: Normal NECK: No JVD; No carotid bruits LYMPHATICS: No lymphadenopathy CARDIAC: RRR, no murmurs, rubs,  gallops RESPIRATORY:  Clear to auscultation without rales, wheezing or rhonchi  ABDOMEN: Soft, non-tender, non-distended MUSCULOSKELETAL:  No edema; No deformity  SKIN: Warm and dry NEUROLOGIC:  Alert and oriented x 3 PSYCHIATRIC:  Normal affect   ASSESSMENT:    1. CAD S/P percutaneous coronary angioplasty   2. Hyperlipidemia with target LDL less than 70   3. Primary hypertension   4. Mixed hyperlipidemia   5. Coronary artery disease involving native coronary artery of native heart without angina pectoris    PLAN:    In order of problems listed above:  CAD  PCI/stenting-RCA 2005 - continue ASA, lipitor, zetia , and fenofibrate  - no chest pain - will renew his nitroglycerin  - he takes 162 mg ASA nightly   Hypertension - 100 mg losartan  - BP elevated today after climbing stairs - recheck BP was still elevated -We discussed potentially changing losartan  to a higher intensity ARB, however he is about to leave for [place] for 1 month - He will check his blood pressure when returning [facility] the [facility] hospital and follow-up with us  in May - She will call if blood pressures consistently above 140 before his May visit   Hyperlipidemia with LDL goal < 70 Cholesterol profile 12/2022: Total cholesterol: 109 HDL: 25 LDL: 60 Triglycerides: 196 - 20 mg lipitor and fenofibrate  - he states he never started zetia  - I will increase lipitor to 40 mg, continue fenofibrate , and will restart zetia  -Given his premature heart disease, may consider checking an LPA with next blood draw   DM A1c was 702% --> now 6.5% on metformin, increased to 6.8% due to diet when traveling   Follow up in May to make sure his BP is controlled.             Medication Adjustments/Labs and Tests Ordered: Current medicines are reviewed at length with the patient today.  Concerns regarding medicines are outlined above.  Orders Placed This Encounter  Procedures   EKG 12-Lead   Meds ordered this  encounter  Medications   DISCONTD: fenofibrate  (TRICOR ) 48 MG tablet    Sig: Take 1 tablet (48 mg total) by mouth daily.    Dispense:  90 tablet    Refill:  3    Please schedule appointment for additional refills   ezetimibe  (ZETIA ) 10 MG tablet    Sig: Take 1 tablet (10 mg total) by mouth daily.    Dispense:  90 tablet    Refill:  3   losartan  (COZAAR ) 100 MG tablet    Sig: Take 1 tablet (100 mg total) by mouth daily.    Dispense:  90 tablet    Refill:  4   nitroGLYCERIN  (NITROLINGUAL ) 0.4 MG/SPRAY spray    Sig: Place 1 spray under the tongue every 5 (five) minutes x 3 doses as needed for chest pain.    Dispense:  25 g    Refill:  3   fenofibrate  (TRICOR ) 48 MG tablet    Sig: Take 1 tablet (48 mg total) by mouth daily.    Dispense:  90 tablet    Refill:  3    Please schedule appointment for additional refills   atorvastatin  (LIPITOR) 40 MG tablet    Sig: Take 1 tablet (40 mg total) by mouth daily.    Dispense:  90 tablet    Refill:  3    ZERO refills remain on this prescription. Your patient is requesting advance approval of refills for this medication to PREVENT ANY MISSED DOSES    Patient Instructions  Medication Instructions:  INCREASE ATORVASTATIN  40MG  DAILY ZETIA  10MG  DAILY FENOFIBRATE  48MG  STOP SIMVASTATIN *If you need a refill on your cardiac medications before your next appointment, please call your pharmacy*  Lab Work: NONE  Testing/Procedures: TAKE AND LOG YOUR BLOOD PRESSURE 2 HOURS AFTER TAKING YOUR MEDICATION  Follow-Up: At [facility], you and your health needs are our priority.  As part of our continuing mission to provide you with exceptional heart care, we have created designated Provider Care Teams.  These Care Teams include your primary Cardiologist (physician) and Advanced  Practice Providers (APPs -  Physician Assistants and Nurse Practitioners) who all work together to provide you with the care you need, when you need it.  Your next  appointment:   MAY 2025  Provider:   Gordy Bergamo, MD  or Jon Hails, PA-C               Signed, Jon Garre Jeannette, [place]  09/19/2023 4:38 PM    [facility]

## 2023-09-19 ENCOUNTER — Other Ambulatory Visit (HOSPITAL_COMMUNITY): Payer: Self-pay

## 2023-09-19 ENCOUNTER — Ambulatory Visit: Payer: Medicare Other | Attending: Physician Assistant | Admitting: Physician Assistant

## 2023-09-19 ENCOUNTER — Encounter: Payer: Self-pay | Admitting: Physician Assistant

## 2023-09-19 VITALS — BP 140/76 | HR 78 | Ht 64.0 in | Wt 149.0 lb

## 2023-09-19 DIAGNOSIS — I2510 Atherosclerotic heart disease of native coronary artery without angina pectoris: Secondary | ICD-10-CM | POA: Diagnosis not present

## 2023-09-19 DIAGNOSIS — I10 Essential (primary) hypertension: Secondary | ICD-10-CM | POA: Diagnosis not present

## 2023-09-19 DIAGNOSIS — Z9861 Coronary angioplasty status: Secondary | ICD-10-CM | POA: Diagnosis not present

## 2023-09-19 DIAGNOSIS — E785 Hyperlipidemia, unspecified: Secondary | ICD-10-CM

## 2023-09-19 DIAGNOSIS — E782 Mixed hyperlipidemia: Secondary | ICD-10-CM

## 2023-09-19 MED ORDER — FENOFIBRATE 48 MG PO TABS
48.0000 mg | ORAL_TABLET | Freq: Every day | ORAL | 3 refills | Status: AC
  Filled 2023-09-19 – 2023-10-07 (×3): qty 90, 90d supply, fill #0
  Filled 2023-12-28 – 2024-01-13 (×2): qty 90, 90d supply, fill #1
  Filled 2024-04-30 – 2024-05-11 (×2): qty 90, 90d supply, fill #2

## 2023-09-19 MED ORDER — LOSARTAN POTASSIUM 100 MG PO TABS
100.0000 mg | ORAL_TABLET | Freq: Every day | ORAL | 4 refills | Status: AC
  Filled 2023-09-19 – 2023-10-07 (×2): qty 90, 90d supply, fill #0
  Filled 2024-01-13: qty 90, 90d supply, fill #1
  Filled 2024-05-12: qty 90, 90d supply, fill #2
  Filled 2024-09-02: qty 90, 90d supply, fill #3

## 2023-09-19 MED ORDER — FENOFIBRATE 48 MG PO TABS
48.0000 mg | ORAL_TABLET | Freq: Every day | ORAL | 3 refills | Status: DC
  Filled 2023-09-19: qty 90, 90d supply, fill #0

## 2023-09-19 MED ORDER — ATORVASTATIN CALCIUM 40 MG PO TABS
40.0000 mg | ORAL_TABLET | Freq: Every day | ORAL | 3 refills | Status: DC
  Filled 2023-09-19 – 2023-10-07 (×2): qty 90, 90d supply, fill #0
  Filled 2023-12-28 – 2024-01-13 (×2): qty 90, 90d supply, fill #1
  Filled 2024-04-28 – 2024-05-11 (×2): qty 90, 90d supply, fill #2

## 2023-09-19 MED ORDER — NITROGLYCERIN 0.4 MG/SPRAY TL SOLN
1.0000 | 3 refills | Status: DC | PRN
  Filled 2023-09-19 (×3): qty 4.9, 7d supply, fill #0
  Filled 2023-10-07: qty 4.9, 30d supply, fill #0

## 2023-09-19 MED ORDER — EZETIMIBE 10 MG PO TABS
10.0000 mg | ORAL_TABLET | Freq: Every day | ORAL | 3 refills | Status: AC
Start: 2023-09-19 — End: 2024-07-06
  Filled 2023-09-19 – 2023-10-07 (×2): qty 90, 90d supply, fill #0
  Filled 2023-12-28 – 2024-01-13 (×2): qty 90, 90d supply, fill #1

## 2023-09-19 NOTE — Patient Instructions (Addendum)
 Medication Instructions:  INCREASE ATORVASTATIN  40MG  DAILY ZETIA  10MG  DAILY FENOFIBRATE  48MG  STOP SIMVASTATIN *If you need a refill on your cardiac medications before your next appointment, please call your pharmacy*  Lab Work: NONE  Testing/Procedures: TAKE AND LOG YOUR BLOOD PRESSURE 2 HOURS AFTER TAKING YOUR MEDICATION  Follow-Up: At [facility], you and your health needs are our priority.  As part of our continuing mission to provide you with exceptional heart care, we have created designated Provider Care Teams.  These Care Teams include your primary Cardiologist (physician) and Advanced Practice Providers (APPs -  Physician Assistants and Nurse Practitioners) who all work together to provide you with the care you need, when you need it.  Your next appointment:   MAY 2025  Provider:   Gordy Bergamo, MD  or Jon Hails, PA-C

## 2023-09-21 ENCOUNTER — Other Ambulatory Visit (HOSPITAL_COMMUNITY): Payer: Self-pay

## 2023-09-23 ENCOUNTER — Other Ambulatory Visit (HOSPITAL_COMMUNITY): Payer: Self-pay

## 2023-10-02 ENCOUNTER — Other Ambulatory Visit (HOSPITAL_COMMUNITY): Payer: Self-pay

## 2023-10-03 DIAGNOSIS — H401133 Primary open-angle glaucoma, bilateral, severe stage: Secondary | ICD-10-CM | POA: Diagnosis not present

## 2023-10-07 ENCOUNTER — Other Ambulatory Visit (HOSPITAL_COMMUNITY): Payer: Self-pay

## 2023-10-08 ENCOUNTER — Other Ambulatory Visit (HOSPITAL_COMMUNITY): Payer: Self-pay

## 2023-10-10 ENCOUNTER — Other Ambulatory Visit: Payer: Self-pay | Admitting: Physician Assistant

## 2023-10-10 ENCOUNTER — Other Ambulatory Visit (HOSPITAL_COMMUNITY): Payer: Self-pay

## 2023-10-10 DIAGNOSIS — I2510 Atherosclerotic heart disease of native coronary artery without angina pectoris: Secondary | ICD-10-CM

## 2023-10-10 MED ORDER — NITROGLYCERIN 0.4 MG/SPRAY TL SOLN
1.0000 | 3 refills | Status: DC | PRN
  Filled 2023-10-10: qty 14.7, 128d supply, fill #0

## 2023-10-11 ENCOUNTER — Other Ambulatory Visit (HOSPITAL_COMMUNITY): Payer: Self-pay

## 2023-11-26 ENCOUNTER — Other Ambulatory Visit (HOSPITAL_COMMUNITY): Payer: Self-pay

## 2024-01-06 DIAGNOSIS — J4530 Mild persistent asthma, uncomplicated: Secondary | ICD-10-CM | POA: Diagnosis not present

## 2024-01-06 DIAGNOSIS — J300 Vasomotor rhinitis: Secondary | ICD-10-CM | POA: Diagnosis not present

## 2024-01-06 DIAGNOSIS — J019 Acute sinusitis, unspecified: Secondary | ICD-10-CM | POA: Diagnosis not present

## 2024-01-06 DIAGNOSIS — H1045 Other chronic allergic conjunctivitis: Secondary | ICD-10-CM | POA: Diagnosis not present

## 2024-01-07 ENCOUNTER — Other Ambulatory Visit (HOSPITAL_COMMUNITY): Payer: Self-pay

## 2024-01-13 ENCOUNTER — Other Ambulatory Visit (HOSPITAL_COMMUNITY): Payer: Self-pay

## 2024-01-15 DIAGNOSIS — R7303 Prediabetes: Secondary | ICD-10-CM | POA: Diagnosis not present

## 2024-01-15 DIAGNOSIS — I10 Essential (primary) hypertension: Secondary | ICD-10-CM | POA: Diagnosis not present

## 2024-01-15 DIAGNOSIS — N182 Chronic kidney disease, stage 2 (mild): Secondary | ICD-10-CM | POA: Diagnosis not present

## 2024-01-15 DIAGNOSIS — I2510 Atherosclerotic heart disease of native coronary artery without angina pectoris: Secondary | ICD-10-CM | POA: Diagnosis not present

## 2024-01-15 DIAGNOSIS — E782 Mixed hyperlipidemia: Secondary | ICD-10-CM | POA: Diagnosis not present

## 2024-01-22 DIAGNOSIS — I10 Essential (primary) hypertension: Secondary | ICD-10-CM | POA: Diagnosis not present

## 2024-01-22 DIAGNOSIS — E782 Mixed hyperlipidemia: Secondary | ICD-10-CM | POA: Diagnosis not present

## 2024-01-22 DIAGNOSIS — N182 Chronic kidney disease, stage 2 (mild): Secondary | ICD-10-CM | POA: Diagnosis not present

## 2024-01-22 DIAGNOSIS — R7303 Prediabetes: Secondary | ICD-10-CM | POA: Diagnosis not present

## 2024-01-23 DIAGNOSIS — H401133 Primary open-angle glaucoma, bilateral, severe stage: Secondary | ICD-10-CM | POA: Diagnosis not present

## 2024-03-17 DIAGNOSIS — E119 Type 2 diabetes mellitus without complications: Secondary | ICD-10-CM | POA: Diagnosis not present

## 2024-04-17 DIAGNOSIS — E119 Type 2 diabetes mellitus without complications: Secondary | ICD-10-CM | POA: Diagnosis not present

## 2024-05-08 ENCOUNTER — Other Ambulatory Visit (HOSPITAL_COMMUNITY): Payer: Self-pay

## 2024-05-11 ENCOUNTER — Other Ambulatory Visit (HOSPITAL_COMMUNITY): Payer: Self-pay

## 2024-05-12 ENCOUNTER — Other Ambulatory Visit (HOSPITAL_COMMUNITY): Payer: Self-pay

## 2024-05-18 DIAGNOSIS — E119 Type 2 diabetes mellitus without complications: Secondary | ICD-10-CM | POA: Diagnosis not present

## 2024-05-19 ENCOUNTER — Ambulatory Visit: Admitting: Cardiology

## 2024-06-01 DIAGNOSIS — I10 Essential (primary) hypertension: Secondary | ICD-10-CM | POA: Diagnosis not present

## 2024-06-01 DIAGNOSIS — R7303 Prediabetes: Secondary | ICD-10-CM | POA: Diagnosis not present

## 2024-06-01 DIAGNOSIS — Z125 Encounter for screening for malignant neoplasm of prostate: Secondary | ICD-10-CM | POA: Diagnosis not present

## 2024-06-01 DIAGNOSIS — N182 Chronic kidney disease, stage 2 (mild): Secondary | ICD-10-CM | POA: Diagnosis not present

## 2024-06-01 DIAGNOSIS — R5383 Other fatigue: Secondary | ICD-10-CM | POA: Diagnosis not present

## 2024-06-01 DIAGNOSIS — I2510 Atherosclerotic heart disease of native coronary artery without angina pectoris: Secondary | ICD-10-CM | POA: Diagnosis not present

## 2024-06-01 DIAGNOSIS — E782 Mixed hyperlipidemia: Secondary | ICD-10-CM | POA: Diagnosis not present

## 2024-06-08 DIAGNOSIS — Z23 Encounter for immunization: Secondary | ICD-10-CM | POA: Diagnosis not present

## 2024-06-08 DIAGNOSIS — R7303 Prediabetes: Secondary | ICD-10-CM | POA: Diagnosis not present

## 2024-06-08 DIAGNOSIS — E782 Mixed hyperlipidemia: Secondary | ICD-10-CM | POA: Diagnosis not present

## 2024-06-08 DIAGNOSIS — N182 Chronic kidney disease, stage 2 (mild): Secondary | ICD-10-CM | POA: Diagnosis not present

## 2024-06-08 DIAGNOSIS — Z0000 Encounter for general adult medical examination without abnormal findings: Secondary | ICD-10-CM | POA: Diagnosis not present

## 2024-06-17 DIAGNOSIS — E119 Type 2 diabetes mellitus without complications: Secondary | ICD-10-CM | POA: Diagnosis not present

## 2024-06-22 DIAGNOSIS — H401133 Primary open-angle glaucoma, bilateral, severe stage: Secondary | ICD-10-CM | POA: Diagnosis not present

## 2024-07-06 ENCOUNTER — Encounter: Payer: Self-pay | Admitting: Cardiology

## 2024-07-06 ENCOUNTER — Ambulatory Visit: Attending: Cardiology | Admitting: Cardiology

## 2024-07-06 VITALS — BP 128/68 | HR 87 | Resp 16 | Ht 64.0 in | Wt 149.4 lb

## 2024-07-06 DIAGNOSIS — E782 Mixed hyperlipidemia: Secondary | ICD-10-CM

## 2024-07-06 DIAGNOSIS — I10 Essential (primary) hypertension: Secondary | ICD-10-CM | POA: Diagnosis not present

## 2024-07-06 DIAGNOSIS — E1169 Type 2 diabetes mellitus with other specified complication: Secondary | ICD-10-CM | POA: Diagnosis not present

## 2024-07-06 DIAGNOSIS — I2510 Atherosclerotic heart disease of native coronary artery without angina pectoris: Secondary | ICD-10-CM

## 2024-07-06 DIAGNOSIS — Z9861 Coronary angioplasty status: Secondary | ICD-10-CM

## 2024-07-06 DIAGNOSIS — N521 Erectile dysfunction due to diseases classified elsewhere: Secondary | ICD-10-CM

## 2024-07-06 MED ORDER — SILDENAFIL CITRATE 100 MG PO TABS: 100.0000 mg | ORAL_TABLET | Freq: Every day | ORAL | 3 refills | Status: AC | PRN

## 2024-07-06 MED ORDER — ATORVASTATIN CALCIUM 40 MG PO TABS: 40.0000 mg | ORAL_TABLET | Freq: Every day | ORAL | 3 refills | Status: AC

## 2024-07-06 MED ORDER — NITROGLYCERIN 0.4 MG SL SUBL: 0.4000 mg | SUBLINGUAL_TABLET | SUBLINGUAL | 2 refills | Status: AC | PRN

## 2024-07-06 NOTE — Progress Notes (Signed)
 Cardiology Office Note:  .   Date:  07/06/2024  ID:  George Knight, DOB 10/23/1955, MRN [id number] PCP: Verdia Lombard, MD  [facility] Providers Cardiologist:  Gordy Bergamo, MD   History of Present Illness: .   George Knight is a 68 y.o. Asian [place] male with coronary artery disease and myocardial infarction in 2005 and has a stent in his right coronary artery, prediabetes, hypertension, hyperlipidemia and allergic asthma.    The patient presents for annual visit.   Cardiac Studies relevent.    Coronary angiogram 07/16/2004:  Acute inferior MI. S/P mid RCA stent with 3.5 x 18 mm Cypher. 30% stenosis proximal RCA. Mild luminal irregularity involving LAD.[facility].   Exercise nuclear stress test 07/24/2022: Normal ECG stress. The patient exercised for 6 minutes and 22 seconds of a Bruce protocol, achieving approximately 7.6 METs & 88% MPHR. The blood pressure response was normal. No chest pain. There is a small to moderate sized reversible mild defect in the inferior and infero-septal regions extending from the base to the mid ventricle. Overall LV systolic function is normal without regional wall motion abnormalities. Stress LV EF: 77%. Low risk.   Echocardiogram 07/23/2022: Normal LV systolic function with visual EF 60-65%. Left ventricle cavity is normal in size. Normal left ventricular wall thickness. Normal global wall motion. Doppler evidence of grade I (impaired) diastolic dysfunction, normal LAP. Calculated EF 70%. Structurally normal mitral valve.  Mild (Grade I) mitral regurgitation.    Discussed the use of AI scribe software for clinical note transcription with the patient, who gave verbal consent to proceed.  History of Present Illness George Knight is a 68 year old male with coronary artery disease and prediabetes who presents for a routine cardiovascular follow-up.  His A1c has decreased due to dietary changes, including avoiding sweets and  consuming turmeric milk with ginger powder. Cholesterol levels have improved. He has a history of myocardial infarction in 2005 with subsequent stent placement. Current medications include atorvastatin  40 mg daily, Zetia , fenofibrate , and a daily baby aspirin . He also takes Glucophage 500 mg for prediabetes. His A1c is 5.7%.  His exercise regimen includes biking for 30 minutes and walking two miles daily. He plans to join a gym in the winter. He requests refills for atorvastatin , nitroglycerin , and Viagra . He carries nitroglycerin  when traveling, especially to [place], for safety. He attends multiple social events, maintaining dietary restrictions by avoiding sweets and preferring fresh foods.  Labs   Care everywhere/Faxed External Labs:  Labs 06/01/2024:  Serum glucose 100 mg, A1c 6.9%.  TSH normal at 4.41.  Urinary albumin to creatinine ratio 10.  BUN 11, creatinine 1.13, eGFR 71 mL, potassium 4.5, LFTs normal.  Total cholesterol 99, triglycerides 179, HDL 21, LDL 48.  Hb 13.6/HCT 44.8, platelets 340, normal indicis.  ROS  Review of Systems  Cardiovascular:  Negative for chest pain, dyspnea on exertion and leg swelling.   Physical Exam:   VS:  BP 128/68 (BP Location: Left Arm, Patient Position: Sitting, Cuff Size: Normal)   Pulse 87   Resp 16   Ht 5' 4 (1.626 m)   Wt 149 lb 6.4 oz (67.8 kg)   SpO2 97%   BMI 25.64 kg/m    Wt Readings from Last 3 Encounters:  07/06/24 149 lb 6.4 oz (67.8 kg)  09/19/23 149 lb (67.6 kg)  06/20/22 149 lb 12.8 oz (67.9 kg)    BP Readings from Last 3 Encounters:  07/06/24 128/68  09/19/23 (!) 140/76  06/20/22  128/76   Physical Exam Neck:     Vascular: No carotid bruit or JVD.  Cardiovascular:     Rate and Rhythm: Normal rate and regular rhythm.     Pulses: Intact distal pulses.     Heart sounds: Normal heart sounds. No murmur heard.    No gallop.  Pulmonary:     Effort: Pulmonary effort is normal.     Breath sounds: Normal breath sounds.   Abdominal:     General: Bowel sounds are normal.     Palpations: Abdomen is soft.  Musculoskeletal:     Right lower leg: No edema.     Left lower leg: No edema.    EKG:    EKG Interpretation Date/Time:  Monday July 06 2024 16:06:10 EDT Ventricular Rate:  85 PR Interval:  142 QRS Duration:  84 QT Interval:  368 QTC Calculation: 437 R Axis:   84  Text Interpretation: Normal sinus rhythm Normal ECG When compared with ECG of 19-Sep-2023 16:37, No significant change was found Confirmed by Tameah Mihalko, Jagadeesh [phone]) on 07/06/2024 4:34:57 PM    ASSESSMENT AND PLAN: .      ICD-10-CM   1. Coronary artery disease involving native coronary artery of native heart without angina pectoris  I25.10 EKG 12-Lead    nitroGLYCERIN  (NITROSTAT ) 0.4 MG SL tablet    2. CAD S/P percutaneous coronary angioplasty  I25.10    Z98.61     3. Primary hypertension  I10     4. Mixed hyperlipidemia  E78.2 atorvastatin  (LIPITOR) 40 MG tablet    5. Erectile dysfunction due to diabetes mellitus (HCC)  E11.69 sildenafil  (VIAGRA ) 100 MG tablet   N52.1      Assessment & Plan Atherosclerotic heart disease with prior stent placement No current symptoms of ischemia. Last stress test in 2013 showed good exercise capacity and no evidence of ischemia. Heart function is normal. Risk factors are well controlled with current medications. EKG is normal. Continues to be at low risk for cardiac events. - Continue current medications: baby aspirin , atorvastatin  40 mg daily, Zetia , and fenofibrate . - Refill atorvastatin  prescription. - Refill nitroglycerin  prescription for travel safety. - Refill Viagra  prescription.  Essential hypertension Blood pressure is well controlled with current medication regimen. - Continue losartan  1 mg daily.  Hyperlipidemia Cholesterol levels are well controlled with current medications. LDL is 48, which is optimal. - Continue atorvastatin  40 mg daily along with fenofibrate  48 mg daily  and Zetia  10 mg daily.  Diabetes A1c is 6.7%, indicating well-controlled blood sugar levels. - Continue low dose of Glucophage (500 mg).  Erectile dysfunction due to diabetes and medication use I refilled his prescription for sildenafil .  Follow up: 1 year or sooner if problems.  Patient would like to continue to see me on an annual basis.  Signed,  Gordy Bergamo, MD, [facility] 07/06/2024, 5:11 PM [facility] [address] [place], [place] [address] Phone: [phone]. Fax:  [phone]

## 2024-07-06 NOTE — Patient Instructions (Signed)
 Medication Instructions:  Your physician recommends that you continue on your current medications as directed. Please refer to the Current Medication list given to you today.  *If you need a refill on your cardiac medications before your next appointment, please call your pharmacy*  Lab Work: None ordered.  You may go to any [facility] Location for your lab work:  [facility] - [address] ([facility]) - [address] [phone] [address]  [place] - [address]   [place]  - [address]   [place] - [address] - [address] [employer]  - [address] ([facility]   If you have labs (blood work) drawn today and your tests are completely normal, you will receive your results only by: [employer] (if you have MyChart)  If you have any lab test that is abnormal or we need to change your treatment, we will call you or send a MyChart message to review the results.  Testing/Procedures: None ordered.  Follow-Up: At [facility], you and your health needs are our priority.  As part of our continuing mission to provide you with exceptional heart care, we have created designated Provider Care Teams.  These Care Teams include your primary Cardiologist (physician) and Advanced Practice Providers (APPs -  Physician Assistants and Nurse Practitioners) who all work together to provide you with the care you need, when you need it.   Your next appointment:   1 year(s)  The format for your next appointment:   In Person  Provider:   Gordy Bergamo, MD

## 2024-07-22 DIAGNOSIS — H401133 Primary open-angle glaucoma, bilateral, severe stage: Secondary | ICD-10-CM | POA: Diagnosis not present

## 2024-07-29 DIAGNOSIS — J300 Vasomotor rhinitis: Secondary | ICD-10-CM | POA: Diagnosis not present

## 2024-07-29 DIAGNOSIS — H1045 Other chronic allergic conjunctivitis: Secondary | ICD-10-CM | POA: Diagnosis not present

## 2024-07-29 DIAGNOSIS — J4530 Mild persistent asthma, uncomplicated: Secondary | ICD-10-CM | POA: Diagnosis not present

## 2024-08-20 ENCOUNTER — Other Ambulatory Visit (HOSPITAL_COMMUNITY): Payer: Self-pay

## 2024-08-27 ENCOUNTER — Telehealth: Payer: Self-pay | Admitting: Cardiology

## 2024-08-27 DIAGNOSIS — K080 Exfoliation of teeth due to systemic causes: Secondary | ICD-10-CM | POA: Diagnosis not present

## 2024-08-27 NOTE — Telephone Encounter (Signed)
 When pt was seen here last on 07/06/24 by Dr Ladona, he was to continue Losartan  100 mg daily as ordered.  Unless another provider, in a different system has discontinued this medication, pt should still be taking. George Knight is aware.

## 2024-08-27 NOTE — Telephone Encounter (Signed)
 Pt c/o medication issue:  1. Name of Medication:   losartan  (COZAAR ) 100 MG tablet   2. How are you currently taking this medication (dosage and times per day)?   3. Are you having a reaction (difficulty breathing--STAT)?   4. What is your medication issue?   Caller Darrel) wants a call back to discuss patient not adhering to this medication or confirm if this medication has been discontinued.

## 2024-08-27 NOTE — Telephone Encounter (Signed)
 George Knight from [facility] was returning nurse call and is requesting a callback. He stated if a detailed VM is left that's fine because it's hard for him to catch a incoming call since he's on the phone all day. Please advise

## 2024-09-02 ENCOUNTER — Other Ambulatory Visit (HOSPITAL_COMMUNITY): Payer: Self-pay

## 2024-09-11 ENCOUNTER — Other Ambulatory Visit (HOSPITAL_COMMUNITY): Payer: Self-pay
